# Patient Record
Sex: Female | Born: 1935 | Race: White | Hispanic: No | Smoking: Never smoker
Health system: Southern US, Community
[De-identification: ages and names within clinical notes are randomized; demographics above are authoritative.]

## PROBLEM LIST (undated history)

## (undated) DIAGNOSIS — I10 Essential (primary) hypertension: Secondary | ICD-10-CM

## (undated) DIAGNOSIS — K859 Acute pancreatitis without necrosis or infection, unspecified: Secondary | ICD-10-CM

## (undated) HISTORY — PX: ABDOMINAL HYSTERECTOMY: SHX81

---

## 2002-01-02 ENCOUNTER — Encounter: Payer: Self-pay | Admitting: Emergency Medicine

## 2002-01-02 ENCOUNTER — Emergency Department (HOSPITAL_COMMUNITY): Admission: EM | Admit: 2002-01-02 | Discharge: 2002-01-02 | Payer: Self-pay | Admitting: Emergency Medicine

## 2006-10-07 ENCOUNTER — Emergency Department (HOSPITAL_COMMUNITY): Admission: EM | Admit: 2006-10-07 | Discharge: 2006-10-07 | Payer: Self-pay | Admitting: Emergency Medicine

## 2009-04-02 ENCOUNTER — Emergency Department (HOSPITAL_COMMUNITY): Admission: EM | Admit: 2009-04-02 | Discharge: 2009-04-02 | Payer: Self-pay | Admitting: Emergency Medicine

## 2009-12-03 ENCOUNTER — Observation Stay (HOSPITAL_COMMUNITY): Admission: EM | Admit: 2009-12-03 | Discharge: 2009-12-04 | Payer: Self-pay | Admitting: Emergency Medicine

## 2009-12-04 ENCOUNTER — Encounter (INDEPENDENT_AMBULATORY_CARE_PROVIDER_SITE_OTHER): Payer: Self-pay | Admitting: Internal Medicine

## 2010-01-15 ENCOUNTER — Emergency Department (HOSPITAL_COMMUNITY): Admission: EM | Admit: 2010-01-15 | Discharge: 2010-01-15 | Payer: Self-pay | Admitting: Emergency Medicine

## 2010-06-16 ENCOUNTER — Inpatient Hospital Stay (HOSPITAL_COMMUNITY): Admission: EM | Admit: 2010-06-16 | Discharge: 2010-06-22 | Payer: Self-pay | Admitting: Emergency Medicine

## 2010-06-18 ENCOUNTER — Encounter (INDEPENDENT_AMBULATORY_CARE_PROVIDER_SITE_OTHER): Payer: Self-pay | Admitting: Gastroenterology

## 2010-06-30 ENCOUNTER — Inpatient Hospital Stay (HOSPITAL_COMMUNITY): Admission: EM | Admit: 2010-06-30 | Discharge: 2010-07-05 | Payer: Self-pay | Admitting: Emergency Medicine

## 2010-07-30 ENCOUNTER — Encounter: Admission: RE | Admit: 2010-07-30 | Discharge: 2010-07-30 | Payer: Self-pay | Admitting: Gastroenterology

## 2010-08-15 ENCOUNTER — Ambulatory Visit (HOSPITAL_COMMUNITY): Admission: RE | Admit: 2010-08-15 | Discharge: 2010-08-15 | Payer: Self-pay | Admitting: Gastroenterology

## 2010-09-05 ENCOUNTER — Encounter
Admission: RE | Admit: 2010-09-05 | Discharge: 2010-09-05 | Payer: Self-pay | Source: Home / Self Care | Attending: Gastroenterology | Admitting: Gastroenterology

## 2010-10-30 ENCOUNTER — Encounter (HOSPITAL_COMMUNITY): Payer: MEDICARE | Attending: Surgery

## 2010-10-30 LAB — CBC
Hemoglobin: 13.8 g/dL (ref 12.0–15.0)
MCV: 86.7 fL (ref 78.0–100.0)
Platelets: 203 10*3/uL (ref 150–400)
RBC: 4.89 MIL/uL (ref 3.87–5.11)
WBC: 8.7 10*3/uL (ref 4.0–10.5)

## 2010-10-30 LAB — SURGICAL PCR SCREEN
MRSA, PCR: NEGATIVE
Staphylococcus aureus: NEGATIVE

## 2010-10-30 LAB — BASIC METABOLIC PANEL
BUN: 12 mg/dL (ref 6–23)
Chloride: 105 mEq/L (ref 96–112)
GFR calc Af Amer: >60 mL/min (ref >60)
Potassium: 4.4 mEq/L (ref 3.5–5.1)

## 2010-11-02 ENCOUNTER — Ambulatory Visit (HOSPITAL_COMMUNITY): Payer: MEDICARE

## 2010-11-02 ENCOUNTER — Other Ambulatory Visit: Payer: Self-pay | Admitting: Surgery

## 2010-11-02 ENCOUNTER — Observation Stay (HOSPITAL_COMMUNITY)
Admission: RE | Admit: 2010-11-02 | Discharge: 2010-11-03 | Disposition: A | Discharge status: Home / Self Care | Payer: MEDICARE | Attending: Surgery | Admitting: Surgery

## 2010-11-02 DIAGNOSIS — K8020 Calculus of gallbladder without cholecystitis without obstruction: Principal | ICD-10-CM | POA: Insufficient documentation

## 2010-11-02 DIAGNOSIS — K219 Gastro-esophageal reflux disease without esophagitis: Secondary | ICD-10-CM | POA: Insufficient documentation

## 2010-11-02 DIAGNOSIS — Z01812 Encounter for preprocedural laboratory examination: Secondary | ICD-10-CM | POA: Insufficient documentation

## 2010-11-02 DIAGNOSIS — R1011 Right upper quadrant pain: Secondary | ICD-10-CM

## 2010-11-02 DIAGNOSIS — K8010 Calculus of gallbladder with chronic cholecystitis without obstruction: Secondary | ICD-10-CM | POA: Insufficient documentation

## 2010-11-02 DIAGNOSIS — I10 Essential (primary) hypertension: Secondary | ICD-10-CM | POA: Insufficient documentation

## 2010-11-03 NOTE — Op Note (Signed)
  [name], Latasha Maxwell               ACCOUNT NO.:  [id number]  MEDICAL RECORD NO.:  [id number]           PATIENT TYPE:  O  LOCATION:  DAYL                         FACILITY:  [facility]  PHYSICIAN:  Thornton Park. Daphine Deutscher, MD  DATE OF BIRTH:  July 26, 1931  DATE OF PROCEDURE: DATE OF DISCHARGE:                              OPERATIVE REPORT   PREOPERATIVE DIAGNOSIS:  A 77 year old white female with history of pancreatitis and pseudocyst related to gallstones.  PROCEDURE:  Laparoscopic cholecystectomy with intraoperative cholangiogram.  POSTOPERATIVE DIAGNOSIS:  Chronic cholecystitis, cholelithiasis.  SURGEON:  Thornton Park. Daphine Deutscher, MD  ASSISTANT:  P.J. Carolynne Edouard.  DESCRIPTION OF PROCEDURE:  This 77 year old white female was brought to OR-1 at [facility] on November 02, 2010 and given general anesthesia. The abdomen was prepped with PCMX and draped sterilely.  I entered the abdomen using Hassan technique through the umbilicus and insufflated. Standard trocar placements confirmed, placed the 10 in the upper midline.  The gallbladder had a lot of adhesions to it which I took down bluntly.  Dissection of Calot's triangle was performed and a critical view was achieved.  A clip was placed on the gallbladder and I incised the cystic duct to get dynamic cholangiogram with C-arm which revealed long cystic duct, intrahepatic filling, free flow in the duodenum, no stones.  The cystic duct was triple clipped and divided.  The gallbladder was removed from the gallbladder bed and then placed in a bag and brought out through the umbilicus.  Gallbladder bed was coagulated near the top.  There was a little bleeder.  Hemostasis was achieved.  There was a little bit of bile spillage at the top as well and this was evacuated but made the wound class 3.  The umbilical defect was repaired under laparoscopic vision with figure- of-eight suture of 0 Vicryl.  Port sites were all injected and were then closed with 4-0  Vicryl with a __________ adhesive to the skin.  The patient was taken to recovery room in satisfactory condition.     Thornton Park Daphine Deutscher, MD     MBM/MEDQ  D:  11/02/2010  T:  11/02/2010  Job:  [id number]  cc:   Willis Modena, MD Fax: [phone]  Dr. Knox Royalty  Electronically Signed by Luretha Murphy MD on 11/03/2010 07:48:26 AM

## 2010-12-04 LAB — AMYLASE, BODY FLUID

## 2010-12-04 LAB — CEA (CARCINOEMBRYONIC ANTIGEN), FLUID: CEA Fluid: <0.5 ng/mL (ref <10.0)

## 2010-12-05 LAB — COMPREHENSIVE METABOLIC PANEL
ALT: 11 U/L (ref 0–35)
ALT: 13 U/L (ref 0–35)
AST: 18 U/L (ref 0–37)
Alkaline Phosphatase: 94 U/L (ref 39–117)
BUN: 3 mg/dL — ABNORMAL LOW (ref 6–23)
CO2: 22 mEq/L (ref 19–32)
Calcium: 8.6 mg/dL (ref 8.4–10.5)
Calcium: 8.7 mg/dL (ref 8.4–10.5)
Calcium: 9.5 mg/dL (ref 8.4–10.5)
Chloride: 105 mEq/L (ref 96–112)
Creatinine, Ser: 0.61 mg/dL (ref 0.4–1.2)
Creatinine, Ser: 0.73 mg/dL (ref 0.4–1.2)
GFR calc Af Amer: >60 mL/min (ref >60)
GFR calc non Af Amer: >60 mL/min (ref >60)
Glucose, Bld: 102 mg/dL — ABNORMAL HIGH (ref 70–99)
Glucose, Bld: 149 mg/dL — ABNORMAL HIGH (ref 70–99)
Glucose, Bld: 89 mg/dL (ref 70–99)
Potassium: 3.9 mEq/L (ref 3.5–5.1)
Sodium: 136 mEq/L (ref 135–145)
Sodium: 137 mEq/L (ref 135–145)
Sodium: 137 mEq/L (ref 135–145)
Total Bilirubin: 1.5 mg/dL — ABNORMAL HIGH (ref 0.3–1.2)
Total Protein: 5.7 g/dL — ABNORMAL LOW (ref 6.0–8.3)
Total Protein: 5.8 g/dL — ABNORMAL LOW (ref 6.0–8.3)
Total Protein: 5.9 g/dL — ABNORMAL LOW (ref 6.0–8.3)
Total Protein: 7.2 g/dL (ref 6.0–8.3)

## 2010-12-05 LAB — CBC
HCT: 31.1 % — ABNORMAL LOW (ref 36.0–46.0)
HCT: 31.2 % — ABNORMAL LOW (ref 36.0–46.0)
HCT: 31.6 % — ABNORMAL LOW (ref 36.0–46.0)
HCT: 32.5 % — ABNORMAL LOW (ref 36.0–46.0)
Hemoglobin: 10.3 g/dL — ABNORMAL LOW (ref 12.0–15.0)
Hemoglobin: 10.5 g/dL — ABNORMAL LOW (ref 12.0–15.0)
MCH: 29.2 pg (ref 26.0–34.0)
MCHC: 33.5 g/dL (ref 30.0–36.0)
MCHC: 33.6 g/dL (ref 30.0–36.0)
MCHC: 33.7 g/dL (ref 30.0–36.0)
MCHC: 33.8 g/dL (ref 30.0–36.0)
MCV: 86.3 fL (ref 78.0–100.0)
MCV: 87.1 fL (ref 78.0–100.0)
MCV: 87.7 fL (ref 78.0–100.0)
Platelets: 452 10*3/uL — ABNORMAL HIGH (ref 150–400)
RBC: 3.55 MIL/uL — ABNORMAL LOW (ref 3.87–5.11)
RDW: 14 % (ref 11.5–15.5)
RDW: 14 % (ref 11.5–15.5)
RDW: 14.1 % (ref 11.5–15.5)
RDW: 14.1 % (ref 11.5–15.5)
RDW: 14.2 % (ref 11.5–15.5)
WBC: 10.4 10*3/uL (ref 4.0–10.5)

## 2010-12-05 LAB — DIFFERENTIAL
Eosinophils Absolute: 0.1 10*3/uL (ref 0.0–0.7)
Eosinophils Relative: 1 % (ref 0–5)
Lymphocytes Relative: 13 % (ref 12–46)
Lymphs Abs: 1.5 10*3/uL (ref 0.7–4.0)
Lymphs Abs: 1.7 10*3/uL (ref 0.7–4.0)
Monocytes Relative: 6 % (ref 3–12)
Monocytes Relative: 8 % (ref 3–12)
Neutro Abs: 8.7 10*3/uL — ABNORMAL HIGH (ref 1.7–7.7)
Neutrophils Relative %: 77 % (ref 43–77)
Neutrophils Relative %: 79 % — ABNORMAL HIGH (ref 43–77)

## 2010-12-05 LAB — GLUCOSE, CAPILLARY
Glucose-Capillary: 123 mg/dL — ABNORMAL HIGH (ref 70–99)
Glucose-Capillary: 126 mg/dL — ABNORMAL HIGH (ref 70–99)
Glucose-Capillary: 155 mg/dL — ABNORMAL HIGH (ref 70–99)
Glucose-Capillary: 162 mg/dL — ABNORMAL HIGH (ref 70–99)
Glucose-Capillary: 175 mg/dL — ABNORMAL HIGH (ref 70–99)
Glucose-Capillary: 189 mg/dL — ABNORMAL HIGH (ref 70–99)

## 2010-12-05 LAB — LIPID PANEL
Cholesterol: 148 mg/dL (ref 0–200)
LDL Cholesterol: 106 mg/dL — ABNORMAL HIGH (ref 0–99)
Total CHOL/HDL Ratio: 7 RATIO
Triglycerides: 106 mg/dL (ref <150)
VLDL: 21 mg/dL (ref 0–40)

## 2010-12-05 LAB — URINE MICROSCOPIC-ADD ON

## 2010-12-05 LAB — PROTIME-INR: INR: 1.13 (ref 0.00–1.49)

## 2010-12-05 LAB — URINE CULTURE: Colony Count: 35000

## 2010-12-05 LAB — BASIC METABOLIC PANEL
BUN: 3 mg/dL — ABNORMAL LOW (ref 6–23)
Chloride: 103 mEq/L (ref 96–112)
Creatinine, Ser: 0.71 mg/dL (ref 0.4–1.2)
GFR calc non Af Amer: >60 mL/min (ref >60)
Glucose, Bld: 139 mg/dL — ABNORMAL HIGH (ref 70–99)
Potassium: 3.8 mEq/L (ref 3.5–5.1)

## 2010-12-05 LAB — URINALYSIS, ROUTINE W REFLEX MICROSCOPIC
Glucose, UA: NEGATIVE mg/dL
Ketones, ur: >80 mg/dL — AB
pH: 5.5 (ref 5.0–8.0)

## 2010-12-05 LAB — LIPASE, BLOOD: Lipase: 134 U/L — ABNORMAL HIGH (ref 11–59)

## 2010-12-05 LAB — APTT: aPTT: 35 seconds (ref 24–37)

## 2010-12-05 LAB — CULTURE, BLOOD (ROUTINE X 2)
Culture  Setup Time: 201110091203
Culture  Setup Time: 201110091208

## 2010-12-05 LAB — GLYCOHEMOGLOBIN, TOTAL: Hemoglobin-A1c: 7.6 % — ABNORMAL HIGH (ref 5.4–7.4)

## 2010-12-06 LAB — BASIC METABOLIC PANEL
BUN: 3 mg/dL — ABNORMAL LOW (ref 6–23)
CO2: 25 mEq/L (ref 19–32)
Chloride: 107 mEq/L (ref 96–112)
Chloride: 107 mEq/L (ref 96–112)
Creatinine, Ser: 0.61 mg/dL (ref 0.4–1.2)
GFR calc Af Amer: >60 mL/min (ref >60)
Glucose, Bld: 143 mg/dL — ABNORMAL HIGH (ref 70–99)
Potassium: 2.8 mEq/L — ABNORMAL LOW (ref 3.5–5.1)
Potassium: 3.7 mEq/L (ref 3.5–5.1)

## 2010-12-06 LAB — COMPREHENSIVE METABOLIC PANEL
ALT: 26 U/L (ref 0–35)
ALT: 48 U/L — ABNORMAL HIGH (ref 0–35)
ALT: 73 U/L — ABNORMAL HIGH (ref 0–35)
AST: 20 U/L (ref 0–37)
AST: 21 U/L (ref 0–37)
AST: 23 U/L (ref 0–37)
AST: 37 U/L (ref 0–37)
Albumin: 2.2 g/dL — ABNORMAL LOW (ref 3.5–5.2)
Albumin: 2.4 g/dL — ABNORMAL LOW (ref 3.5–5.2)
Albumin: 2.4 g/dL — ABNORMAL LOW (ref 3.5–5.2)
Albumin: 3 g/dL — ABNORMAL LOW (ref 3.5–5.2)
Alkaline Phosphatase: 110 U/L (ref 39–117)
Alkaline Phosphatase: 81 U/L (ref 39–117)
Alkaline Phosphatase: 99 U/L (ref 39–117)
BUN: 10 mg/dL (ref 6–23)
BUN: 11 mg/dL (ref 6–23)
CO2: 21 mEq/L (ref 19–32)
Calcium: 7.5 mg/dL — ABNORMAL LOW (ref 8.4–10.5)
Chloride: 106 mEq/L (ref 96–112)
Chloride: 110 mEq/L (ref 96–112)
Creatinine, Ser: 0.76 mg/dL (ref 0.4–1.2)
Creatinine, Ser: 0.76 mg/dL (ref 0.4–1.2)
GFR calc Af Amer: 54 mL/min — ABNORMAL LOW (ref >60)
GFR calc Af Amer: >60 mL/min (ref >60)
GFR calc Af Amer: >60 mL/min (ref >60)
GFR calc Af Amer: >60 mL/min (ref >60)
GFR calc Af Amer: >60 mL/min (ref >60)
GFR calc non Af Amer: >60 mL/min (ref >60)
Glucose, Bld: 121 mg/dL — ABNORMAL HIGH (ref 70–99)
Glucose, Bld: 79 mg/dL (ref 70–99)
Potassium: 2.8 mEq/L — ABNORMAL LOW (ref 3.5–5.1)
Potassium: 3 mEq/L — ABNORMAL LOW (ref 3.5–5.1)
Potassium: 3.7 mEq/L (ref 3.5–5.1)
Potassium: 4.1 mEq/L (ref 3.5–5.1)
Sodium: 133 mEq/L — ABNORMAL LOW (ref 135–145)
Sodium: 137 mEq/L (ref 135–145)
Sodium: 139 mEq/L (ref 135–145)
Total Bilirubin: 1.3 mg/dL — ABNORMAL HIGH (ref 0.3–1.2)
Total Bilirubin: 1.7 mg/dL — ABNORMAL HIGH (ref 0.3–1.2)
Total Protein: 5 g/dL — ABNORMAL LOW (ref 6.0–8.3)
Total Protein: 5.3 g/dL — ABNORMAL LOW (ref 6.0–8.3)
Total Protein: 5.4 g/dL — ABNORMAL LOW (ref 6.0–8.3)
Total Protein: 6.5 g/dL (ref 6.0–8.3)

## 2010-12-06 LAB — CULTURE, BLOOD (ROUTINE X 2)
Culture  Setup Time: 201109251437
Culture  Setup Time: 201109251437
Culture: NO GROWTH

## 2010-12-06 LAB — CBC
HCT: 31.7 % — ABNORMAL LOW (ref 36.0–46.0)
HCT: 32.2 % — ABNORMAL LOW (ref 36.0–46.0)
HCT: 43.8 % (ref 36.0–46.0)
Hemoglobin: 10.8 g/dL — ABNORMAL LOW (ref 12.0–15.0)
MCH: 29.9 pg (ref 26.0–34.0)
MCH: 29.9 pg (ref 26.0–34.0)
MCH: 30 pg (ref 26.0–34.0)
MCHC: 33.5 g/dL (ref 30.0–36.0)
MCV: 88.3 fL (ref 78.0–100.0)
MCV: 88.4 fL (ref 78.0–100.0)
MCV: 88.6 fL (ref 78.0–100.0)
MCV: 89.4 fL (ref 78.0–100.0)
Platelets: 126 10*3/uL — ABNORMAL LOW (ref 150–400)
Platelets: 156 10*3/uL (ref 150–400)
Platelets: 168 10*3/uL (ref 150–400)
Platelets: 249 10*3/uL (ref 150–400)
Platelets: 282 10*3/uL (ref 150–400)
RBC: 3.51 MIL/uL — ABNORMAL LOW (ref 3.87–5.11)
RBC: 3.59 MIL/uL — ABNORMAL LOW (ref 3.87–5.11)
RBC: 3.61 MIL/uL — ABNORMAL LOW (ref 3.87–5.11)
RBC: 4.08 MIL/uL (ref 3.87–5.11)
RDW: 14.1 % (ref 11.5–15.5)
RDW: 14.3 % (ref 11.5–15.5)
RDW: 14.3 % (ref 11.5–15.5)
RDW: 14.7 % (ref 11.5–15.5)
WBC: 15.5 10*3/uL — ABNORMAL HIGH (ref 4.0–10.5)
WBC: 17 10*3/uL — ABNORMAL HIGH (ref 4.0–10.5)
WBC: 17.4 10*3/uL — ABNORMAL HIGH (ref 4.0–10.5)
WBC: 22.9 10*3/uL — ABNORMAL HIGH (ref 4.0–10.5)

## 2010-12-06 LAB — LIPASE, BLOOD: Lipase: 35 U/L (ref 11–59)

## 2010-12-06 LAB — URINE MICROSCOPIC-ADD ON

## 2010-12-06 LAB — DIFFERENTIAL
Basophils Relative: 0 % (ref 0–1)
Eosinophils Absolute: 0 10*3/uL (ref 0.0–0.7)
Eosinophils Absolute: 0.2 10*3/uL (ref 0.0–0.7)
Eosinophils Relative: 1 % (ref 0–5)
Lymphs Abs: 0.9 10*3/uL (ref 0.7–4.0)
Lymphs Abs: 1.3 10*3/uL (ref 0.7–4.0)
Monocytes Absolute: 0.7 10*3/uL (ref 0.1–1.0)
Monocytes Absolute: 0.8 10*3/uL (ref 0.1–1.0)
Monocytes Relative: 5 % (ref 3–12)
Neutro Abs: 21.3 10*3/uL — ABNORMAL HIGH (ref 1.7–7.7)

## 2010-12-06 LAB — URINE CULTURE

## 2010-12-06 LAB — CLOSTRIDIUM DIFFICILE EIA

## 2010-12-06 LAB — URINALYSIS, ROUTINE W REFLEX MICROSCOPIC
Bilirubin Urine: NEGATIVE
Glucose, UA: NEGATIVE mg/dL
Specific Gravity, Urine: 1.022 (ref 1.005–1.030)
pH: 5.5 (ref 5.0–8.0)

## 2010-12-06 LAB — CLOSTRIDIUM DIFFICILE BY PCR: Toxigenic C. Difficile by PCR: NOT DETECTED

## 2010-12-06 LAB — MRSA PCR SCREENING: MRSA by PCR: NEGATIVE

## 2010-12-11 LAB — POCT I-STAT, CHEM 8
Chloride: 103 mEq/L (ref 96–112)
Creatinine, Ser: 0.9 mg/dL (ref 0.4–1.2)
Glucose, Bld: 123 mg/dL — ABNORMAL HIGH (ref 70–99)
HCT: 45 % (ref 36.0–46.0)
Hemoglobin: 15.3 g/dL — ABNORMAL HIGH (ref 12.0–15.0)
Potassium: 3.8 mEq/L (ref 3.5–5.1)
Sodium: 139 mEq/L (ref 135–145)

## 2010-12-11 LAB — COMPREHENSIVE METABOLIC PANEL
AST: 1063 U/L — ABNORMAL HIGH (ref 0–37)
Albumin: 3.9 g/dL (ref 3.5–5.2)
BUN: 10 mg/dL (ref 6–23)
Calcium: 9.5 mg/dL (ref 8.4–10.5)
Creatinine, Ser: 0.94 mg/dL (ref 0.4–1.2)
GFR calc Af Amer: >60 mL/min (ref >60)
Total Protein: 6.7 g/dL (ref 6.0–8.3)

## 2010-12-11 LAB — CBC
MCHC: 34.5 g/dL (ref 30.0–36.0)
MCV: 88.7 fL (ref 78.0–100.0)
Platelets: 180 10*3/uL (ref 150–400)
RDW: 13.8 % (ref 11.5–15.5)
WBC: 3.9 10*3/uL — ABNORMAL LOW (ref 4.0–10.5)

## 2010-12-11 LAB — DIFFERENTIAL
Eosinophils Relative: 1 % (ref 0–5)
Lymphocytes Relative: 28 % (ref 12–46)
Lymphs Abs: 1.1 10*3/uL (ref 0.7–4.0)
Monocytes Absolute: 0.3 10*3/uL (ref 0.1–1.0)
Neutro Abs: 2.4 10*3/uL (ref 1.7–7.7)

## 2010-12-11 LAB — URINALYSIS, ROUTINE W REFLEX MICROSCOPIC
Bilirubin Urine: NEGATIVE
Hgb urine dipstick: NEGATIVE
Nitrite: NEGATIVE
Protein, ur: NEGATIVE mg/dL
Specific Gravity, Urine: 1.011 (ref 1.005–1.030)
Urobilinogen, UA: 0.2 mg/dL (ref 0.0–1.0)

## 2010-12-11 LAB — POCT CARDIAC MARKERS
CKMB, poc: <1 ng/mL — ABNORMAL LOW (ref 1.0–8.0)
Myoglobin, poc: 51.6 ng/mL (ref 12–200)
Troponin i, poc: <0.05 ng/mL (ref 0.00–0.09)

## 2010-12-17 LAB — CBC
Hemoglobin: 12.5 g/dL (ref 12.0–15.0)
MCHC: 33.9 g/dL (ref 30.0–36.0)
MCHC: 34.4 g/dL (ref 30.0–36.0)
Platelets: 213 10*3/uL (ref 150–400)
RBC: 4.06 MIL/uL (ref 3.87–5.11)
RDW: 13.4 % (ref 11.5–15.5)

## 2010-12-17 LAB — DIFFERENTIAL
Basophils Absolute: 0.1 10*3/uL (ref 0.0–0.1)
Basophils Relative: 0 % (ref 0–1)
Eosinophils Absolute: 0.1 10*3/uL (ref 0.0–0.7)
Eosinophils Relative: 1 % (ref 0–5)
Lymphocytes Relative: 15 % (ref 12–46)
Monocytes Absolute: 0.7 10*3/uL (ref 0.1–1.0)

## 2010-12-17 LAB — HEPATIC FUNCTION PANEL
AST: 21 U/L (ref 0–37)
Bilirubin, Direct: <0.1 mg/dL (ref 0.0–0.3)
Total Bilirubin: 0.7 mg/dL (ref 0.3–1.2)

## 2010-12-17 LAB — CULTURE, BLOOD (ROUTINE X 2): Culture: NO GROWTH

## 2010-12-17 LAB — LIPID PANEL
Cholesterol: 258 mg/dL — ABNORMAL HIGH (ref 0–200)
HDL: 44 mg/dL (ref >39)
LDL Cholesterol: 132 mg/dL — ABNORMAL HIGH (ref 0–99)
Total CHOL/HDL Ratio: 4.6 RATIO
Total CHOL/HDL Ratio: 5.9 RATIO
Triglycerides: 136 mg/dL (ref <150)
VLDL: 25 mg/dL (ref 0–40)
VLDL: 27 mg/dL (ref 0–40)

## 2010-12-17 LAB — BASIC METABOLIC PANEL
BUN: 13 mg/dL (ref 6–23)
CO2: 24 mEq/L (ref 19–32)
CO2: 25 mEq/L (ref 19–32)
Calcium: 9 mg/dL (ref 8.4–10.5)
Calcium: 9.2 mg/dL (ref 8.4–10.5)
Creatinine, Ser: 1.1 mg/dL (ref 0.4–1.2)
GFR calc Af Amer: >60 mL/min (ref >60)
GFR calc non Af Amer: 48 mL/min — ABNORMAL LOW (ref >60)
Glucose, Bld: 136 mg/dL — ABNORMAL HIGH (ref 70–99)
Potassium: 3.7 mEq/L (ref 3.5–5.1)
Sodium: 134 mEq/L — ABNORMAL LOW (ref 135–145)
Sodium: 139 mEq/L (ref 135–145)

## 2010-12-17 LAB — D-DIMER, QUANTITATIVE: D-Dimer, Quant: 0.76 ug/mL-FEU — ABNORMAL HIGH (ref 0.00–0.48)

## 2010-12-17 LAB — CARDIAC PANEL(CRET KIN+CKTOT+MB+TROPI)
Relative Index: INVALID (ref 0.0–2.5)
Relative Index: INVALID (ref 0.0–2.5)
Troponin I: 0.01 ng/mL (ref 0.00–0.06)

## 2010-12-17 LAB — TROPONIN I: Troponin I: 0.02 ng/mL (ref 0.00–0.06)

## 2010-12-17 LAB — CK TOTAL AND CKMB (NOT AT ARMC)
CK, MB: 1.4 ng/mL (ref 0.3–4.0)
Relative Index: INVALID (ref 0.0–2.5)
Total CK: 46 U/L (ref 7–177)

## 2010-12-17 LAB — TSH: TSH: 3.319 u[IU]/mL (ref 0.350–4.500)

## 2010-12-30 LAB — POCT I-STAT, CHEM 8
Calcium, Ion: 1.18 mmol/L (ref 1.12–1.32)
Chloride: 109 mEq/L (ref 96–112)
HCT: 42 % (ref 36.0–46.0)
TCO2: 24 mmol/L (ref 0–100)

## 2011-02-07 NOTE — Discharge Summary (Signed)
  [name], Latasha Maxwell               ACCOUNT NO.:  [id number]  MEDICAL RECORD NO.:  [id number]           PATIENT TYPE:  I  LOCATION:  1529                         FACILITY:  [facility]  PHYSICIAN:  Thornton Park. Daphine Deutscher, MD  DATE OF BIRTH:  May 07, 1931  DATE OF ADMISSION:  11/02/2010 DATE OF DISCHARGE:  11/03/2010                              DISCHARGE SUMMARY   CHIEF COMPLAINT:  History of pancreatitis, gallstone-related.  PROCEDURE:  Laparoscopic cholecystectomy with intraoperative cholangiogram.  COURSE IN THE HOSPITAL:  The patient really was an overnight stay for a laparoscopic cholecystectomy.  She was admitted on February 10 and had a laparoscopic cholecystectomy without difficulty.  She did well.  She was kept over and ready for discharge on postoperative day #1, which would be November 03, 2010.  CONDITION ON DISCHARGE:  Condition good.  DIET:  She is taking a regular diet.  No pain.  FOLLOWUP:  Follow up in the office in 2 to 3 weeks.  FINAL DIAGNOSIS:  Apparent biliary pancreatitis status post laparoscopic cholecystectomy.     Thornton Park Daphine Deutscher, MD     MBM/MEDQ  D:  02/01/2011  T:  02/01/2011  Job:  [id number]  Electronically Signed by Luretha Murphy MD on 02/07/2011 07:06:06 AM

## 2011-06-26 IMAGING — CR DG CHEST 1V PORT
1 series · 1 of 1 positions shown · non-contrast
Comparison: 06/18/2010

CLINICAL DATA: Abdominal pain

PORTABLE CHEST - 1 VIEW

[series [date]]
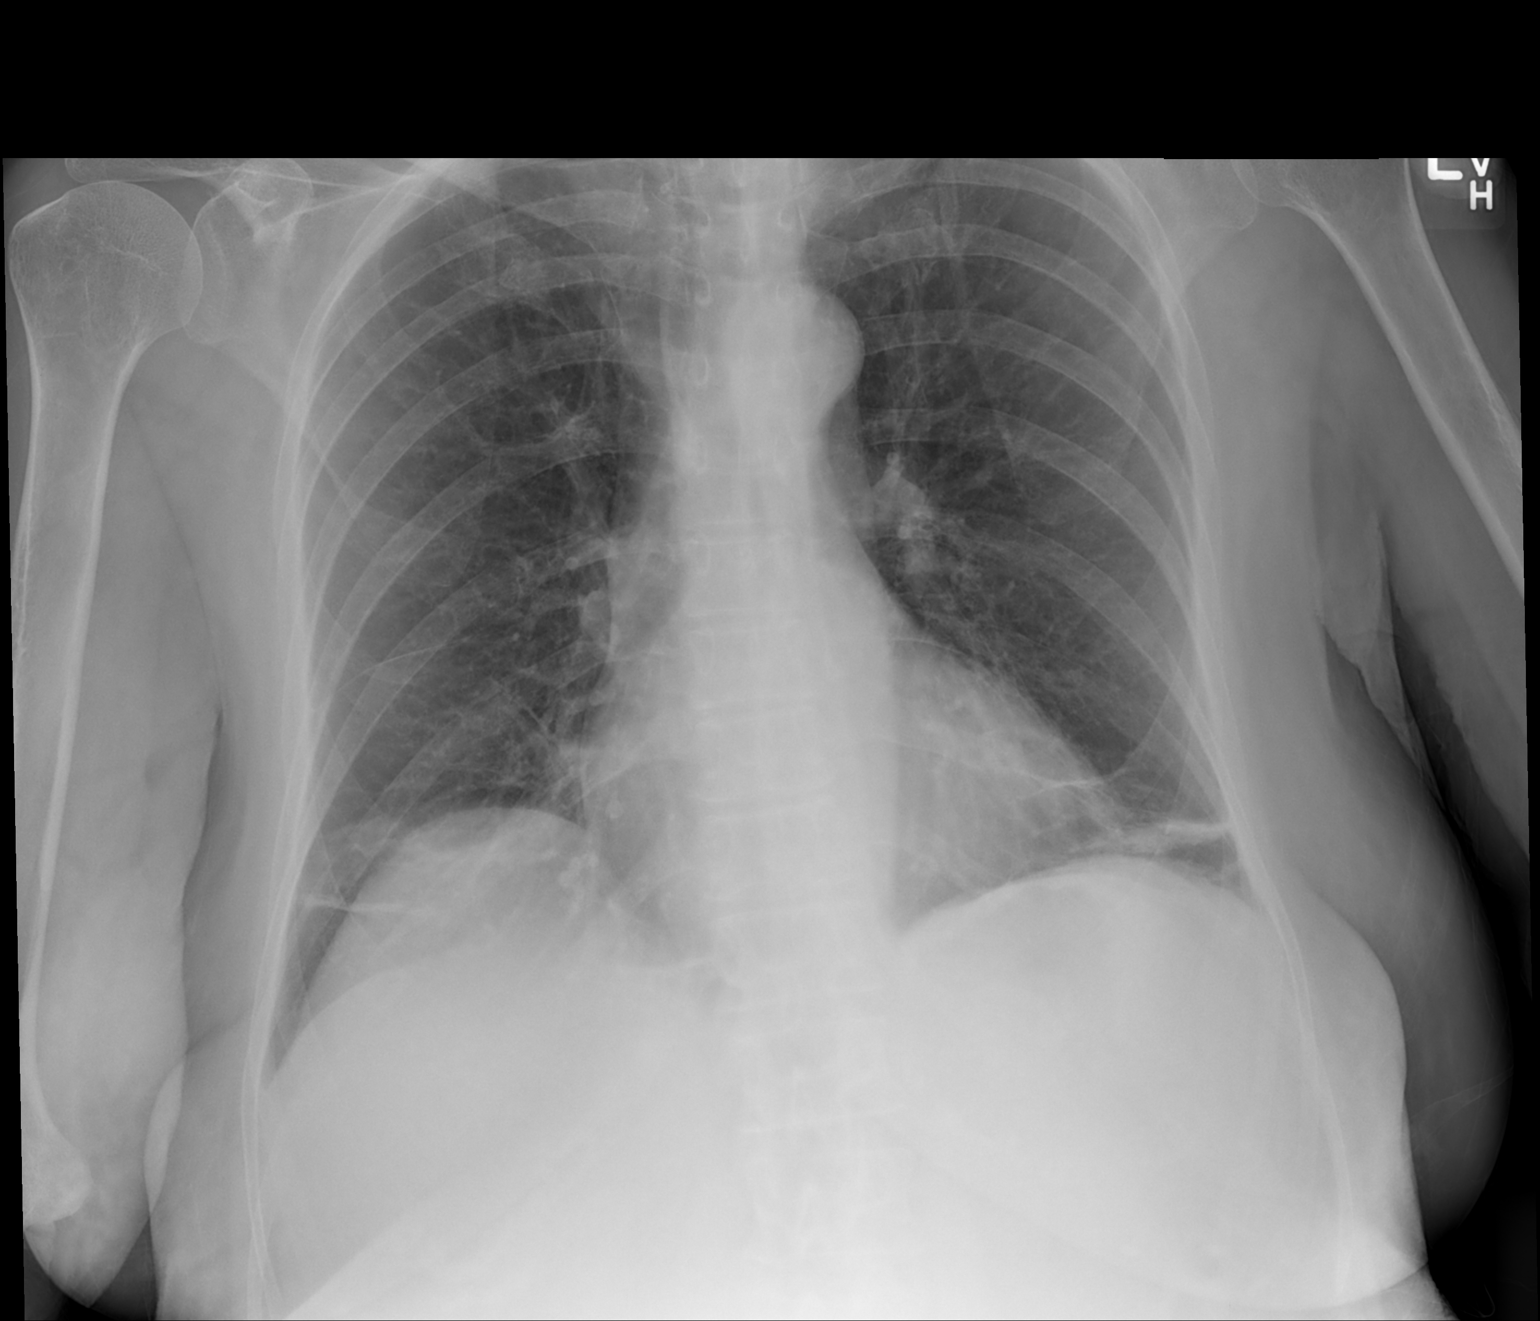

[1 of 1 positions shown; findings below may reference images not displayed]

FINDINGS: Aeration is improved with persistent linear plate-like
bilateral lower lobe atelectasis or scarring noted.  Heart size is
normal.  No new pulmonary opacity.  No pleural effusion.
IMPRESSION: Improved aeration with bibasilar atelectasis or scarring.

## 2011-06-26 IMAGING — CT CT ABD-PELV W/ CM
2 of 5 series · 16 of 46 positions shown, 18 images · IV contrast (APPLIED)
Comparison: 06/16/2010

CLINICAL DATA: Pancreatitis.  Leukocytosis.

CT ABDOMEN AND PELVIS WITH CONTRAST
TECHNIQUE: Multidetector CT imaging of the abdomen and pelvis was
performed following the standard protocol during bolus
administration of intravenous contrast.
Contrast: 125 ml Emnipaque-2QQ

[Series 2: abd_pel 5.0 b40f st · axial · 0.72mm/px · z∈[-432,-26]mm · 13 of 91 slices shown, 15 images]
[im 5/91  soft-tissue]
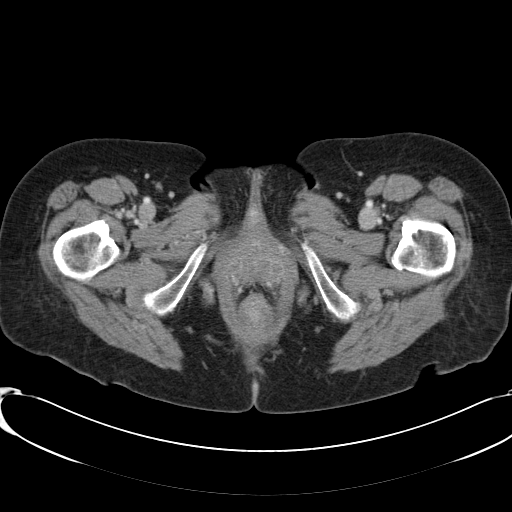
[im 5/91  bone]
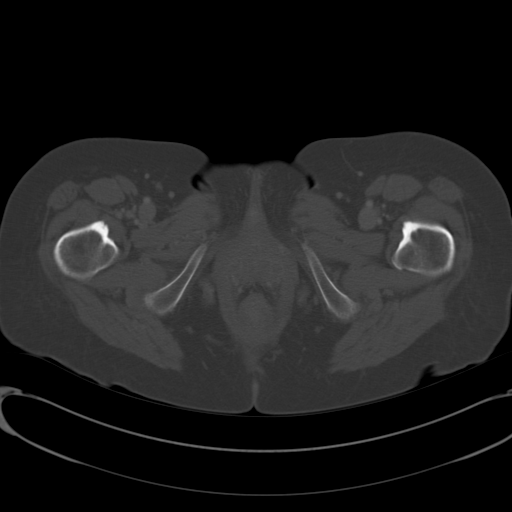
[im 15/91  soft-tissue]
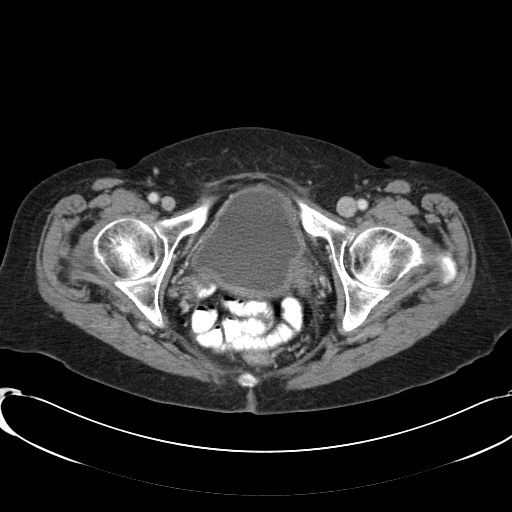
[im 19/91  soft-tissue]
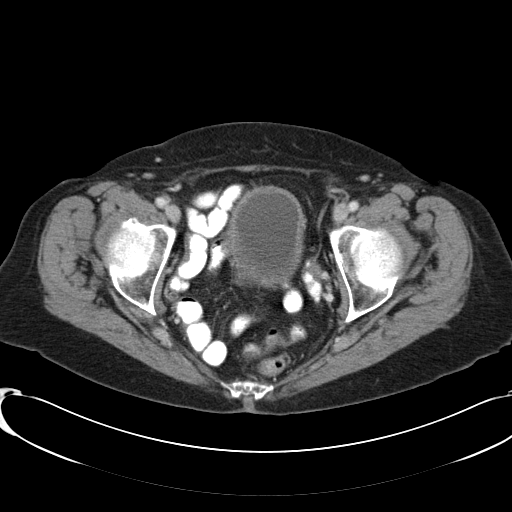
[im 24/91  soft-tissue]
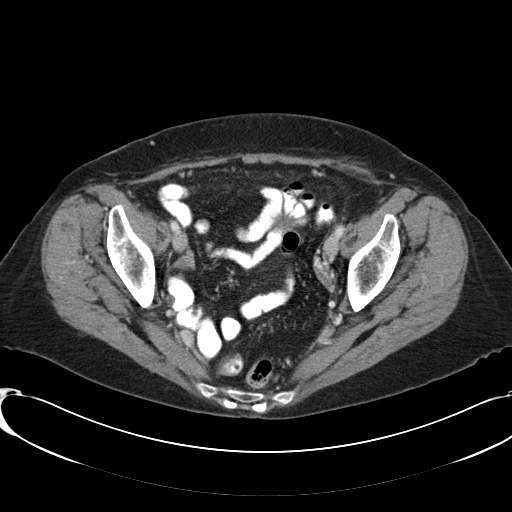
[im 34/91  soft-tissue]
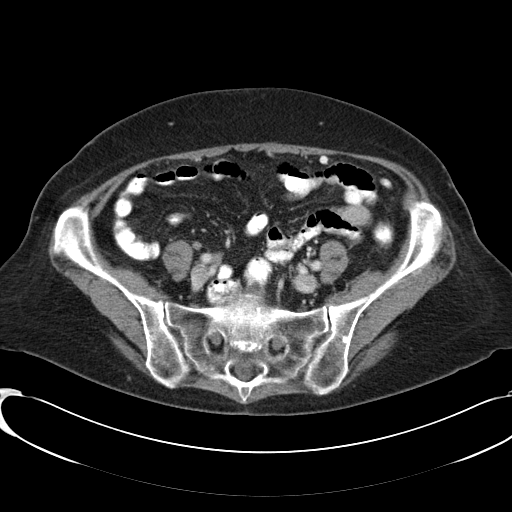
[im 38/91  soft-tissue]
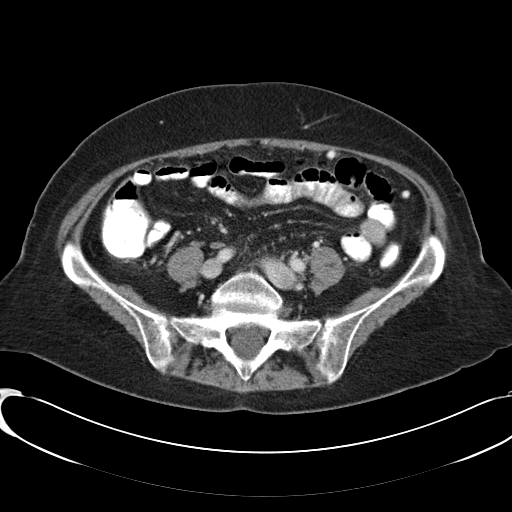
[im 48/91  soft-tissue]
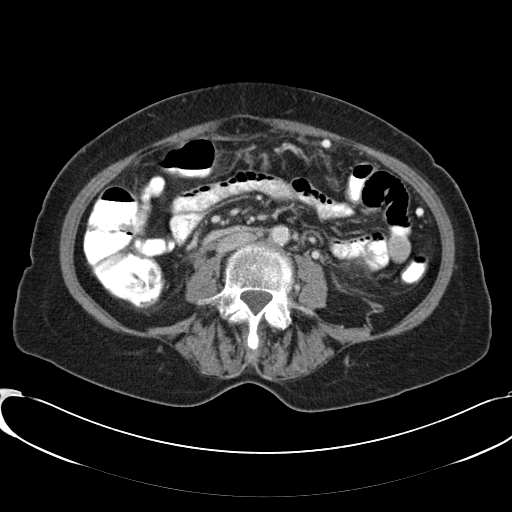
[im 53/91  soft-tissue]
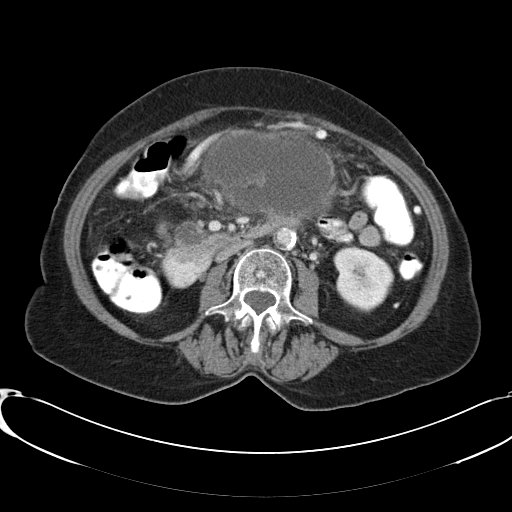
[im 57/91  soft-tissue]
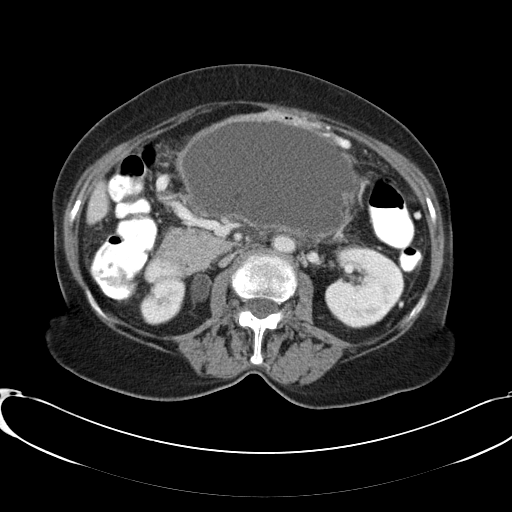
[im 57/91  bone]
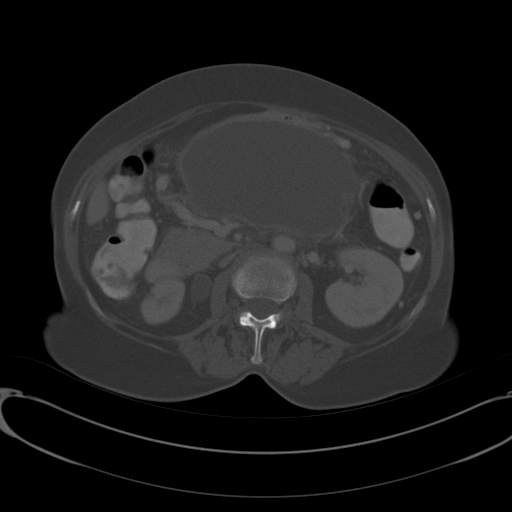
[im 67/91  soft-tissue]
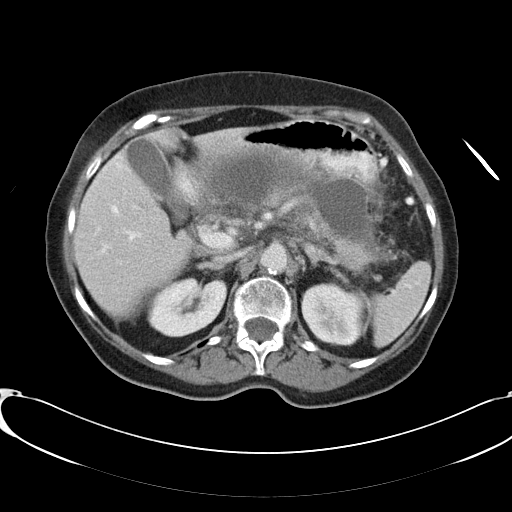
[im 72/91  soft-tissue]
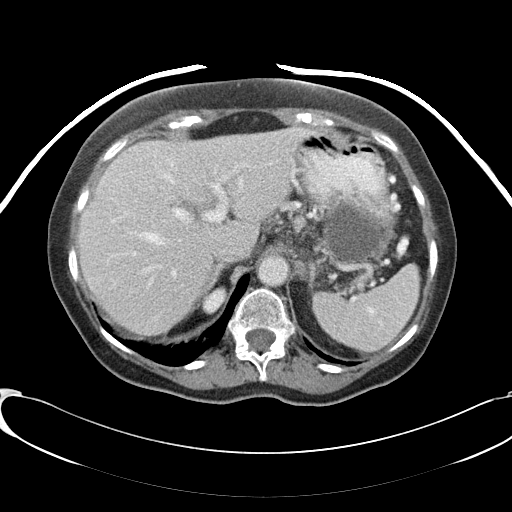
[im 76/91  soft-tissue]
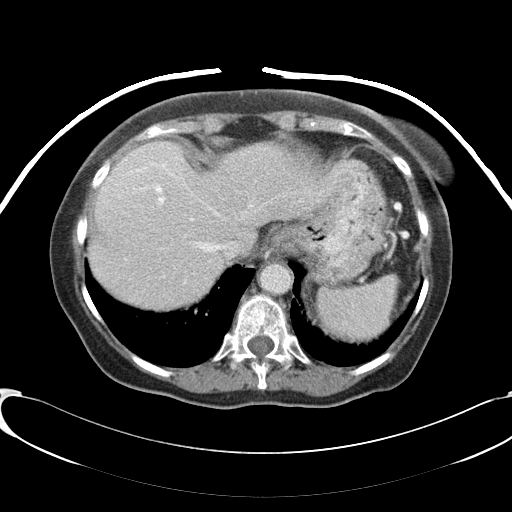
[im 86/91  soft-tissue]
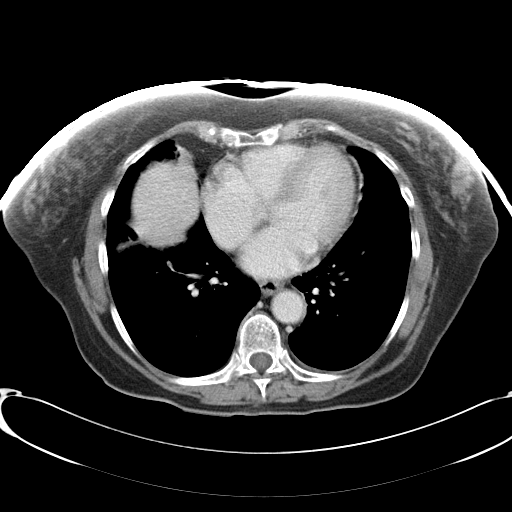

[Series 602: coronal abdomen · coronal · 0.92mm/px · 3 of 126 slices shown]
[im 42/126  soft-tissue]
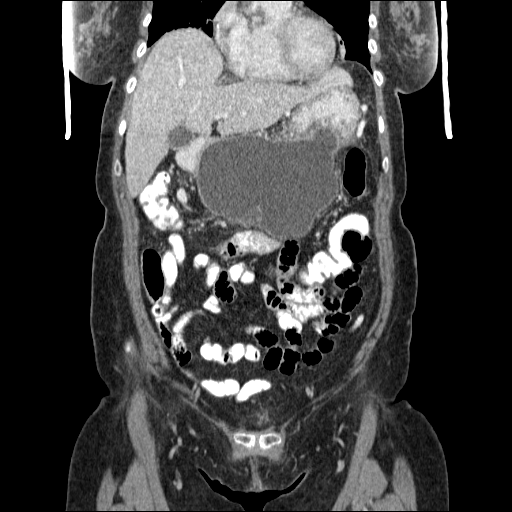
[im 56/126  soft-tissue]
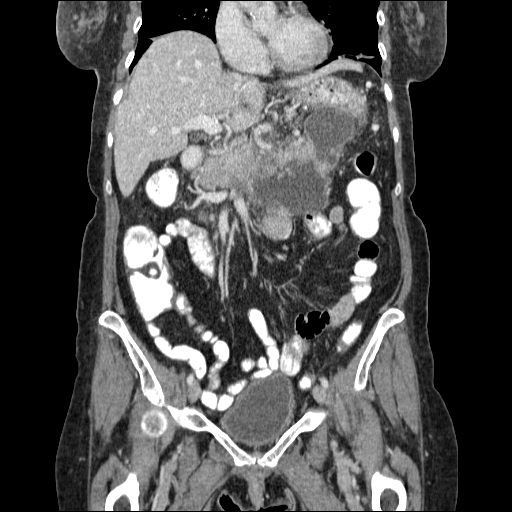
[im 70/126  soft-tissue]
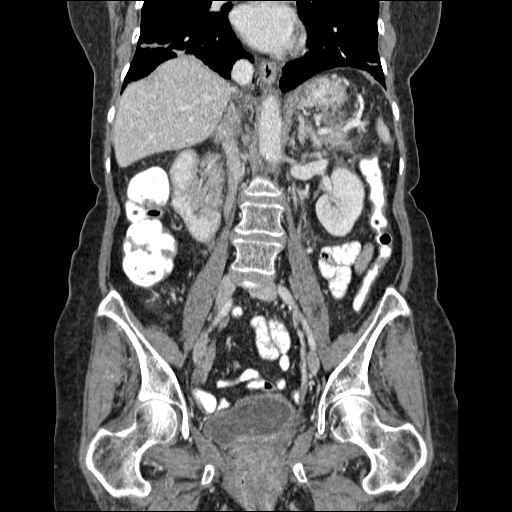

[16 of 46 positions shown; findings below may reference images not displayed]

FINDINGS: Imaging through the lung bases reveals subsegmental
atelectasis.

The liver and spleen are unremarkable. A large fluid collection is
identified between the stomach and a pancreas, measuring 13.2 x
x 9.4 cm.  This has a relatively well organized rim and appears to
be arising along the anterior aspect the pancreas, suggesting that
it is in the anterior pararenal space as opposed to a lesser sac.
This collection tracks up into the splenic hilum.  There is some
heterogeneous enhancement of the pancreatic parenchyma between the
body and the tail regions.  A second area of cystic change is seen
along the anterior aspect of the uncinate process with a tiny
cystic focus in the parenchyma of the uncinate.

There is some edema in the gastrohepatic ligament.

The gallbladder and adrenal glands are unremarkable.  Prominent
right extrarenal pelvis noted although no overt hydronephrosis is
seen in either kidney.  No abdominal aortic aneurysm.

Imaging through the pelvis shows no intraperitoneal free fluid.
Mild bladder wall thickening is evident.  There is evidence of
pelvic floor laxity with a cystocele and probable rectocele.
Diverticulosis is noted in the left colon without diverticulitis.
The terminal ileum is normal. The appendix is not visualized, but
there is no edema or inflammation in the region of the cecum.

Bone windows reveal no worrisome lytic or sclerotic osseous
lesions.
IMPRESSION: Large well-defined fluid collection in the posterior abdomen
appears to be in the anterior pararenal space.  This is just
anterior to the pancreas and is compatible with a large pseudocyst.

New small cystic foci are seen just anterior to the head of the
pancreas and also within the uncinate process, likely related to
the previous pancreatic inflammation.

I personally discussed these results by telephone with Dr. [employer]

## 2017-05-27 ENCOUNTER — Encounter (HOSPITAL_COMMUNITY): Payer: Self-pay | Admitting: *Deleted

## 2017-05-27 ENCOUNTER — Emergency Department (HOSPITAL_COMMUNITY): Payer: Medicare Other

## 2017-05-27 ENCOUNTER — Emergency Department (HOSPITAL_COMMUNITY)
Admission: EM | Admit: 2017-05-27 | Discharge: 2017-05-28 | Disposition: A | Discharge status: Home / Self Care | Payer: Medicare Other | Attending: Emergency Medicine | Admitting: Emergency Medicine

## 2017-05-27 DIAGNOSIS — R0789 Other chest pain: Secondary | ICD-10-CM | POA: Diagnosis not present

## 2017-05-27 DIAGNOSIS — R079 Chest pain, unspecified: Secondary | ICD-10-CM | POA: Diagnosis present

## 2017-05-27 HISTORY — DX: Essential (primary) hypertension: I10

## 2017-05-27 HISTORY — DX: Acute pancreatitis without necrosis or infection, unspecified: K85.90

## 2017-05-27 LAB — CBC
HEMATOCRIT: 41.7 % (ref 36.0–46.0)
HEMOGLOBIN: 13.7 g/dL (ref 12.0–15.0)
MCH: 28.7 pg (ref 26.0–34.0)
MCHC: 32.9 g/dL (ref 30.0–36.0)
MCV: 87.4 fL (ref 78.0–100.0)
Platelets: 169 10*3/uL (ref 150–400)
RBC: 4.77 MIL/uL (ref 3.87–5.11)
RDW: 14 % (ref 11.5–15.5)
WBC: 11.3 10*3/uL — ABNORMAL HIGH (ref 4.0–10.5)

## 2017-05-27 LAB — BASIC METABOLIC PANEL
ANION GAP: 9 (ref 5–15)
BUN: 12 mg/dL (ref 6–20)
CHLORIDE: 104 mmol/L (ref 101–111)
CO2: 23 mmol/L (ref 22–32)
Calcium: 9.5 mg/dL (ref 8.9–10.3)
Creatinine, Ser: 0.86 mg/dL (ref 0.44–1.00)
GFR calc non Af Amer: >60 mL/min (ref >60)
Glucose, Bld: 120 mg/dL — ABNORMAL HIGH (ref 65–99)
Potassium: 3.9 mmol/L (ref 3.5–5.1)
Sodium: 136 mmol/L (ref 135–145)

## 2017-05-27 LAB — I-STAT TROPONIN, ED: Troponin i, poc: 0 ng/mL (ref 0.00–0.08)

## 2017-05-27 NOTE — ED Notes (Signed)
Patient transported to X-ray 

## 2017-05-27 NOTE — ED Triage Notes (Signed)
Pt to ED by EMS c/o sudden substernal CP worse with inspiration. EMS gave 324mg  ASA and nitro x1 which relieved pain. Initial bp 212/170, decreased to 158/88.

## 2017-05-27 NOTE — ED Provider Notes (Signed)
[facility]-EMERGENCY DEPT Provider Note   CSN: [id number] Arrival date & time: 05/27/17  2233     History   Chief Complaint Chief Complaint  Patient presents with  . Chest Pain    HPI Latasha Maxwell is a 83 y.o. female.  Patient was awoken from sleep with substernal chest pain that radiated to her back. No nausea, vomiting, shortness of breath, or diaphoresis. Patient was given one NTG and 324 mg aspirin by EMS with resolution of pain.    Chest Pain   This is a new problem. The current episode started 1 to 2 hours ago. The problem has been resolved. The pain is associated with rest. The pain is present in the substernal region. The pain is moderate. The quality of the pain is described as pressure-like. The pain radiates to the mid back. Pertinent negatives include no abdominal pain, no back pain, no fever, no leg pain, no lower extremity edema, no shortness of breath, no vomiting and no weakness. Risk factors include being elderly.  Her past medical history is significant for hypertension.    No past medical history on file.  There are no active problems to display for this patient.   No past surgical history on file.  OB History    No data available       Home Medications    Prior to Admission medications   Not on File    Family History No family history on file.  Social History Social History  Substance Use Topics  . Smoking status: Not on file  . Smokeless tobacco: Not on file  . Alcohol use Not on file     Allergies   Patient has no allergy information on record.   Review of Systems Review of Systems  Constitutional: Negative for fever.  Respiratory: Negative for shortness of breath.   Cardiovascular: Positive for chest pain.  Gastrointestinal: Negative for abdominal pain and vomiting.  Musculoskeletal: Negative for back pain.  Neurological: Negative for weakness.  All other systems reviewed and are negative.    Physical Exam Updated Vital  Signs BP (!) 158/75 (BP Location: Right Arm)   Pulse 87   Temp 98.7 F (37.1 C) (Oral)   Resp 16   SpO2 96%   Physical Exam  Constitutional: She is oriented to person, place, and time. She appears well-nourished. No distress.  HENT:  Head: Normocephalic.  Eyes: Conjunctivae are normal.  Neck: Neck supple. No JVD present.  Cardiovascular: Normal rate and regular rhythm.   Pulmonary/Chest: Effort normal and breath sounds normal.  Abdominal: Soft. Bowel sounds are normal.  Musculoskeletal: Normal range of motion. She exhibits no edema.  Neurological: She is alert and oriented to person, place, and time.  Skin: Skin is warm and dry.  Psychiatric: She has a normal mood and affect.  Nursing note and vitals reviewed.    ED Treatments / Results  Labs (all labs ordered are listed, but only abnormal results are displayed) Labs Reviewed  BASIC METABOLIC PANEL - Abnormal; Notable for the following:       Result Value   Glucose, Bld 120 (*)    All other components within normal limits  CBC - Abnormal; Notable for the following:    WBC 11.3 (*)    All other components within normal limits  I-STAT TROPONIN, ED  CBG MONITORING, ED  I-STAT TROPONIN, ED  I-STAT TROPONIN, ED    EKG  EKG Interpretation None       Radiology Dg Chest  2 View  Result Date: 05/27/2017 CLINICAL DATA:  Chest pain EXAM: CHEST  2 VIEW COMPARISON:  07/01/2010 FINDINGS: Linear scarring or atelectasis in the right mid lung and left base. No acute consolidation or pleural effusion. Normal cardiomediastinal silhouette with atherosclerosis. No pneumothorax. Mild anterior wedging of a midthoracic vertebra. IMPRESSION: Linear atelectasis or scar within the bilateral lower lungs. No acute infiltrate or edema. Electronically Signed   By: Jasmine PangKim  Fujinaga M.D.   On: 05/27/2017 23:47    Procedures Procedures (including critical care time)  Medications Ordered in ED Medications - No data to display   Initial  Impression / Assessment and Plan / ED Course  I have reviewed the triage vital signs and the nursing notes.  Pertinent labs & imaging results that were available during my care of the patient were reviewed by me and considered in my medical decision making (see chart for details).     Patient remains chest pain free. Delta troponin pending. Patient signed out to Dr. Jeraldine LootsLockwood pending completion of testing.   Final Clinical Impressions(s) / ED Diagnoses   Final diagnoses:  None    New Prescriptions New Prescriptions   No medications on file     Felicie MornSmith, Khaiden Segreto, NP 05/28/17 [id number]    Gerhard MunchLockwood, Robert, MD 05/28/17 [phone]

## 2017-05-28 LAB — I-STAT TROPONIN, ED: TROPONIN I, POC: 0 ng/mL (ref 0.00–0.08)

## 2017-05-28 NOTE — Discharge Instructions (Signed)
As discussed, your evaluation today has been largely reassuring.  But, it is important that you monitor your condition carefully, and do not hesitate to return to the ED if you develop new, or concerning changes in your condition. ? ?Otherwise, please follow-up with your physician for appropriate ongoing care. ? ?

## 2017-05-28 NOTE — ED Notes (Signed)
Pt denies pain. Steady on feet. Son with pt. States understanding of dc instructions. Pt requesting to walk out with son. VSS.

## 2019-09-30 DIAGNOSIS — Z131 Encounter for screening for diabetes mellitus: Secondary | ICD-10-CM | POA: Diagnosis not present

## 2019-09-30 DIAGNOSIS — R5383 Other fatigue: Secondary | ICD-10-CM | POA: Diagnosis not present

## 2019-09-30 DIAGNOSIS — E559 Vitamin D deficiency, unspecified: Secondary | ICD-10-CM | POA: Diagnosis not present

## 2019-09-30 DIAGNOSIS — Z1159 Encounter for screening for other viral diseases: Secondary | ICD-10-CM | POA: Diagnosis not present

## 2019-09-30 DIAGNOSIS — Z0000 Encounter for general adult medical examination without abnormal findings: Secondary | ICD-10-CM | POA: Diagnosis not present

## 2019-09-30 DIAGNOSIS — I10 Essential (primary) hypertension: Secondary | ICD-10-CM | POA: Diagnosis not present

## 2019-09-30 DIAGNOSIS — R0602 Shortness of breath: Secondary | ICD-10-CM | POA: Diagnosis not present

## 2019-10-07 DIAGNOSIS — N183 Chronic kidney disease, stage 3 unspecified: Secondary | ICD-10-CM | POA: Diagnosis not present

## 2019-10-07 DIAGNOSIS — E789 Disorder of lipoprotein metabolism, unspecified: Secondary | ICD-10-CM | POA: Diagnosis not present

## 2019-10-07 DIAGNOSIS — R0602 Shortness of breath: Secondary | ICD-10-CM | POA: Diagnosis not present

## 2019-10-07 DIAGNOSIS — I10 Essential (primary) hypertension: Secondary | ICD-10-CM | POA: Diagnosis not present

## 2019-10-26 ENCOUNTER — Observation Stay (HOSPITAL_BASED_OUTPATIENT_CLINIC_OR_DEPARTMENT_OTHER): Payer: Medicare Other

## 2019-10-26 ENCOUNTER — Observation Stay (HOSPITAL_COMMUNITY)
Admission: EM | Admit: 2019-10-26 | Discharge: 2019-10-26 | Disposition: A | Discharge status: Home / Self Care | Payer: Medicare Other | Attending: Internal Medicine | Admitting: Internal Medicine

## 2019-10-26 ENCOUNTER — Other Ambulatory Visit: Payer: Self-pay

## 2019-10-26 ENCOUNTER — Encounter (HOSPITAL_COMMUNITY): Payer: Self-pay | Admitting: Emergency Medicine

## 2019-10-26 DIAGNOSIS — Z79899 Other long term (current) drug therapy: Secondary | ICD-10-CM | POA: Insufficient documentation

## 2019-10-26 DIAGNOSIS — K625 Hemorrhage of anus and rectum: Principal | ICD-10-CM | POA: Diagnosis present

## 2019-10-26 DIAGNOSIS — I361 Nonrheumatic tricuspid (valve) insufficiency: Secondary | ICD-10-CM | POA: Diagnosis not present

## 2019-10-26 DIAGNOSIS — K922 Gastrointestinal hemorrhage, unspecified: Secondary | ICD-10-CM | POA: Diagnosis not present

## 2019-10-26 DIAGNOSIS — I10 Essential (primary) hypertension: Secondary | ICD-10-CM | POA: Diagnosis not present

## 2019-10-26 DIAGNOSIS — Z20822 Contact with and (suspected) exposure to covid-19: Secondary | ICD-10-CM | POA: Diagnosis not present

## 2019-10-26 DIAGNOSIS — I351 Nonrheumatic aortic (valve) insufficiency: Secondary | ICD-10-CM | POA: Diagnosis not present

## 2019-10-26 DIAGNOSIS — R9431 Abnormal electrocardiogram [ECG] [EKG]: Secondary | ICD-10-CM | POA: Diagnosis not present

## 2019-10-26 DIAGNOSIS — Z03818 Encounter for observation for suspected exposure to other biological agents ruled out: Secondary | ICD-10-CM | POA: Diagnosis not present

## 2019-10-26 LAB — COMPREHENSIVE METABOLIC PANEL
ALT: 14 U/L (ref 0–44)
ALT: 15 U/L (ref 0–44)
AST: 20 U/L (ref 15–41)
AST: 23 U/L (ref 15–41)
Albumin: 4.4 g/dL (ref 3.5–5.0)
Albumin: 4.9 g/dL (ref 3.5–5.0)
Alkaline Phosphatase: 79 U/L (ref 38–126)
Alkaline Phosphatase: 86 U/L (ref 38–126)
Anion gap: 9 (ref 5–15)
Anion gap: 12 (ref 5–15)
BUN: 13 mg/dL (ref 8–23)
BUN: 13 mg/dL (ref 8–23)
CO2: 25 mmol/L (ref 22–32)
CO2: 25 mmol/L (ref 22–32)
Calcium: 9.7 mg/dL (ref 8.9–10.3)
Calcium: 9.9 mg/dL (ref 8.9–10.3)
Chloride: 105 mmol/L (ref 98–111)
Chloride: 103 mmol/L (ref 98–111)
Creatinine, Ser: 0.95 mg/dL (ref 0.44–1.00)
Creatinine, Ser: 0.93 mg/dL (ref 0.44–1.00)
GFR calc Af Amer: >60 mL/min (ref >60)
GFR calc Af Amer: >60 mL/min (ref >60)
GFR calc non Af Amer: 54 mL/min — ABNORMAL LOW (ref >60)
GFR calc non Af Amer: 56 mL/min — ABNORMAL LOW (ref >60)
Glucose, Bld: 114 mg/dL — ABNORMAL HIGH (ref 70–99)
Glucose, Bld: 115 mg/dL — ABNORMAL HIGH (ref 70–99)
Potassium: 3.8 mmol/L (ref 3.5–5.1)
Potassium: 3.9 mmol/L (ref 3.5–5.1)
Sodium: 139 mmol/L (ref 135–145)
Sodium: 140 mmol/L (ref 135–145)
Total Bilirubin: 1 mg/dL (ref 0.3–1.2)
Total Bilirubin: 1.4 mg/dL — ABNORMAL HIGH (ref 0.3–1.2)
Total Protein: 7.7 g/dL (ref 6.5–8.1)
Total Protein: 8.1 g/dL (ref 6.5–8.1)

## 2019-10-26 LAB — CBC
HCT: 45 % (ref 36.0–46.0)
HCT: 48.1 % — ABNORMAL HIGH (ref 36.0–46.0)
HCT: 44.2 % (ref 36.0–46.0)
Hemoglobin: 14.3 g/dL (ref 12.0–15.0)
Hemoglobin: 15 g/dL (ref 12.0–15.0)
Hemoglobin: 14.2 g/dL (ref 12.0–15.0)
MCH: 28.9 pg (ref 26.0–34.0)
MCH: 28.7 pg (ref 26.0–34.0)
MCH: 28.7 pg (ref 26.0–34.0)
MCHC: 31.8 g/dL (ref 30.0–36.0)
MCHC: 31.2 g/dL (ref 30.0–36.0)
MCHC: 32.1 g/dL (ref 30.0–36.0)
MCV: 90.9 fL (ref 80.0–100.0)
MCV: 92 fL (ref 80.0–100.0)
MCV: 89.5 fL (ref 80.0–100.0)
Platelets: 179 10*3/uL (ref 150–400)
Platelets: 199 10*3/uL (ref 150–400)
Platelets: 178 10*3/uL (ref 150–400)
RBC: 4.95 MIL/uL (ref 3.87–5.11)
RBC: 5.23 MIL/uL — ABNORMAL HIGH (ref 3.87–5.11)
RBC: 4.94 MIL/uL (ref 3.87–5.11)
RDW: 14 % (ref 11.5–15.5)
RDW: 13.9 % (ref 11.5–15.5)
RDW: 13.9 % (ref 11.5–15.5)
WBC: 8 10*3/uL (ref 4.0–10.5)
WBC: 7.8 10*3/uL (ref 4.0–10.5)
WBC: 7.9 10*3/uL (ref 4.0–10.5)
nRBC: 0 % (ref 0.0–0.2)
nRBC: 0 % (ref 0.0–0.2)
nRBC: 0 % (ref 0.0–0.2)

## 2019-10-26 LAB — TYPE AND SCREEN
ABO/RH(D): B POS
Antibody Screen: NEGATIVE

## 2019-10-26 LAB — ECHOCARDIOGRAM COMPLETE
Height: 63 in
Weight: 2320 oz

## 2019-10-26 LAB — ABO/RH: ABO/RH(D): B POS

## 2019-10-26 LAB — RESPIRATORY PANEL BY RT PCR (FLU A&B, COVID)
Influenza A by PCR: NEGATIVE
Influenza B by PCR: NEGATIVE
SARS Coronavirus 2 by RT PCR: NEGATIVE

## 2019-10-26 LAB — POC OCCULT BLOOD, ED: Fecal Occult Bld: POSITIVE — AB

## 2019-10-26 MED ORDER — ACETAMINOPHEN 325 MG PO TABS: 650.0000 mg | ORAL_TABLET | Freq: Four times a day (QID) | ORAL | Status: DC | PRN

## 2019-10-26 MED ORDER — ACETAMINOPHEN 650 MG RE SUPP: 650.0000 mg | Freq: Four times a day (QID) | RECTAL | Status: DC | PRN

## 2019-10-26 MED ORDER — HYDRALAZINE HCL 20 MG/ML IJ SOLN
5.0000 mg | Freq: Four times a day (QID) | INTRAMUSCULAR | Status: DC | PRN
  Administered 2019-10-26: 05:00:00 5 mg via INTRAVENOUS
  Filled 2019-10-26: qty 1

## 2019-10-26 MED ORDER — SODIUM CHLORIDE 0.9 % IV SOLN: INTRAVENOUS | Status: AC

## 2019-10-26 NOTE — Progress Notes (Signed)
Patient given discharge instructions, and verbalized an understanding of all discharge instructions.  Patient agrees with discharge plan, and is being discharged in stable medical condition.  Patient given transportation via wheelchair.  Shloma Roggenkamp RN 

## 2019-10-26 NOTE — Progress Notes (Signed)
  Echocardiogram 2D Echocardiogram has been performed.  Leta Jungling M 10/26/2019, 11:33 AM

## 2019-10-26 NOTE — ED Provider Notes (Signed)
[facility]-EMERGENCY DEPT Provider Note   CSN: [id number] Arrival date & time: 10/26/19  0207     History Chief Complaint  Patient presents with  . Rectal Bleeding    Latasha Maxwell is a 86 y.o. female.  The history is provided by the patient and medical records.  Rectal Bleeding    86 year old female with history of hypertension and pancreatitis, presenting to the ED with rectal bleeding.  This just began last evening when going to the bathroom.  States it appeared to be bright red blood with some small intermixed clots.  States this is never happened to her before.  She has felt some continued oozing so has been wearing a pad, continues having small amounts of blood/streaking.  She is not currently on anticoagulation.  Reports having colonoscopy "years ago" that was normal aside from a few polyps.  She denies any current abdominal pain, nausea, vomiting, or diarrhea.  Past Medical History:  Diagnosis Date  . Hypertension   . Pancreatitis     There are no problems to display for this patient.   Past Surgical History:  Procedure Laterality Date  . ABDOMINAL HYSTERECTOMY       OB History   No obstetric history on file.     History reviewed. No pertinent family history.  Social History   Tobacco Use  . Smoking status: Never Smoker  . Smokeless tobacco: Never Used  Substance Use Topics  . Alcohol use: No  . Drug use: No    Home Medications Prior to Admission medications   Medication Sig Start Date End Date Taking? Authorizing Provider  hydrochlorothiazide (HYDRODIURIL) 25 MG tablet Take 25 mg by mouth daily.    [provider]  metoprolol tartrate (LOPRESSOR) 100 MG tablet Take 100 mg by mouth every evening.    [provider]  metoprolol tartrate (LOPRESSOR) 50 MG tablet Take 50 mg by mouth every morning.    [provider]    Allergies    Patient has no known allergies.  Review of Systems   Review of  Systems  Gastrointestinal: Positive for hematochezia.  All other systems reviewed and are negative.   Physical Exam Updated Vital Signs BP (!) 213/104 (BP Location: Right Arm)   Pulse 99   Temp 97.6 F (36.4 C) (Oral)   Resp 17   Ht 5\' 3"  (1.6 m)   Wt 65.8 kg   SpO2 96%   BMI 25.69 kg/m   Physical Exam Vitals and nursing note reviewed. Exam conducted with a chaperone present.  Constitutional:      Appearance: She is well-developed.  HENT:     Head: Normocephalic and atraumatic.  Eyes:     Conjunctiva/sclera: Conjunctivae normal.     Pupils: Pupils are equal, round, and reactive to light.  Cardiovascular:     Rate and Rhythm: Normal rate and regular rhythm.     Heart sounds: Normal heart sounds.  Pulmonary:     Effort: Pulmonary effort is normal. No respiratory distress.     Breath sounds: Normal breath sounds. No rhonchi.  Abdominal:     General: Bowel sounds are normal.     Palpations: Abdomen is soft.  Genitourinary:    Comments: Exam chaperoned by NT Dried blood surrounding rectal, there is bright red blood noted on DRE, no internal masses noted, chronic appearing external hemorrhoids without active bleeding or signs of thrombosis, no active oozing of blood from rectum noted Musculoskeletal:  General: Normal range of motion.     Cervical back: Normal range of motion.  Skin:    General: Skin is warm and dry.  Neurological:     Mental Status: She is alert and oriented to person, place, and time.     ED Results / Procedures / Treatments   Labs (all labs ordered are listed, but only abnormal results are displayed) Labs Reviewed  COMPREHENSIVE METABOLIC PANEL - Abnormal; Notable for the following components:      Result Value   Glucose, Bld 114 (*)    GFR calc non Af Amer 54 (*)    All other components within normal limits  POC OCCULT BLOOD, ED - Abnormal; Notable for the following components:   Fecal Occult Bld POSITIVE (*)    All other components  within normal limits  RESPIRATORY PANEL BY RT PCR (FLU A&B, COVID)  CBC  TYPE AND SCREEN  ABO/RH    EKG None  Radiology No results found.  Procedures Procedures (including critical care time)  Medications Ordered in ED Medications  hydrALAZINE (APRESOLINE) injection 5 mg (has no administration in time range)    ED Course  I have reviewed the triage vital signs and the nursing notes.  Pertinent labs & imaging results that were available during my care of the patient were reviewed by me and considered in my medical decision making (see chart for details).    MDM Rules/Calculators/A&P  86 year old female presenting to the ED with rectal bleeding that began last evening.  She has not had any associated abdominal pain.  Bleeding has been bright red in color.  She is not currently on anticoagulation.  Patient is afebrile and nontoxic in appearance, hemodynamically stable.  Abdomen is soft and benign.  She does have bright red blood on digital rectal exam, no masses or internal hemorrhoids noted.  Labs are grossly reassuring with stable hemoglobin of 14.3.  Did previously see Dr. Paulita Fujita with [facility] but no recent follow-up in about 10 years.  Suspect this is likely diverticular given bright red color and lack of pain.  Will plan to admit to hospitalist for observation and GI evaluation in the AM.  Discussed with hospitalist, Dr. Maudie Mercury-- will admit for ongoing care.  Final Clinical Impression(s) / ED Diagnoses Final diagnoses:  Acute lower GI bleeding    Rx / DC Orders ED Discharge Orders    None       Larene Pickett, PA-C 10/26/19 1610    Merryl Hacker, MD 10/26/19 [phone]

## 2019-10-26 NOTE — Consult Note (Signed)
Referring Provider: Dr. Margo Aye Primary Care Physician:  Knox Royalty, MD Primary Gastroenterologist:  Dr. Dulce Sellar  Reason for Consultation:  GI bleed  HPI: Latasha Maxwell is a 86 y.o. female with reported history of pancreatitis who had one episode of rectal bleeding this morning and reportedly has had black stools for the past week. Heme positive stool. Denies abdominal pain, nausea, vomiting, hematochezia. Denies bleeding since admit. Colonoscopy years ago but records not found. EGD in 2011 that showed a hiatal hernia. Wants to eat. Complains of headache and sinus issues.  Past Medical History:  Diagnosis Date  . Hypertension   . Pancreatitis     Past Surgical History:  Procedure Laterality Date  . ABDOMINAL HYSTERECTOMY      Prior to Admission medications   Medication Sig Start Date End Date Taking? Authorizing Provider  metoprolol tartrate (LOPRESSOR) 100 MG tablet Take 50-100 mg by mouth See admin instructions. 50 MG in the AM and 100 MG in the evening   Yes [provider]    Scheduled Meds: Continuous Infusions: PRN Meds:.acetaminophen **OR** acetaminophen, hydrALAZINE  Allergies as of 10/26/2019  . (No Known Allergies)    Family History  Problem Relation Age of Onset  . CAD Mother   . Diabetes Father     Social History   Socioeconomic History  . Marital status: Divorced    Spouse name: Not on file  . Number of children: Not on file  . Years of education: Not on file  . Highest education level: Not on file  Occupational History  . Not on file  Tobacco Use  . Smoking status: Never Smoker  . Smokeless tobacco: Never Used  Substance and Sexual Activity  . Alcohol use: No  . Drug use: No  . Sexual activity: Not on file  Other Topics Concern  . Not on file  Social History Narrative  . Not on file   Social Determinants of Health   Financial Resource Strain:   . Difficulty of Paying Living Expenses: Not on file  Food Insecurity:   . Worried  About Programme researcher, broadcasting/film/video in the Last Year: Not on file  . Ran Out of Food in the Last Year: Not on file  Transportation Needs:   . Lack of Transportation (Medical): Not on file  . Lack of Transportation (Non-Medical): Not on file  Physical Activity:   . Days of Exercise per Week: Not on file  . Minutes of Exercise per Session: Not on file  Stress:   . Feeling of Stress : Not on file  Social Connections:   . Frequency of Communication with Friends and Family: Not on file  . Frequency of Social Gatherings with Friends and Family: Not on file  . Attends Religious Services: Not on file  . Active Member of Clubs or Organizations: Not on file  . Attends Banker Meetings: Not on file  . Marital Status: Not on file  Intimate Partner Violence:   . Fear of Current or Ex-Partner: Not on file  . Emotionally Abused: Not on file  . Physically Abused: Not on file  . Sexually Abused: Not on file    Review of Systems: All negative except as stated above in HPI.  Physical Exam: Vital signs: Vitals:   10/26/19 0517 10/26/19 1356  BP: (!) 161/84 (!) 147/81  Pulse: 91 84  Resp:  16  Temp: 98 F (36.7 C) 98.4 F (36.9 C)  SpO2: 97% 97%   Last BM Date:  10/25/19 General:   Elderly, lethargic, thin, pleasant, no acute distress  Head: normocephalic, atraumatic Eyes: anicteric sclera ENT: oropharynx clear Neck: supple, nontender Lungs:  Clear throughout to auscultation.   No wheezes, crackles, or rhonchi. No acute distress. Heart:  Regular rate and rhythm; no murmurs, clicks, rubs,  or gallops. Abdomen: soft, nontender, nondistended, +BS  Rectal:  Deferred Ext: no edema  GI:  Lab Results: Recent Labs    10/26/19 0244 10/26/19 0440 10/26/19 1147  WBC 8.0 7.8 7.9  HGB 14.3 15.0 14.2  HCT 45.0 48.1* 44.2  PLT 179 199 178   BMET Recent Labs    10/26/19 0244 10/26/19 0440  NA 139 140  K 3.8 3.9  CL 105 103  CO2 25 25  GLUCOSE 114* 115*  BUN 13 13  CREATININE  0.95 0.93  CALCIUM 9.7 9.9   LFT Recent Labs    10/26/19 0440  PROT 8.1  ALBUMIN 4.9  AST 23  ALT 15  ALKPHOS 86  BILITOT 1.4*   PT/INR No results for input(s): LABPROT, INR in the last 72 hours.    Impression/Plan: Rectal bleeding with normal Hgb. No signs of ongoing bleeding and would manage conservatively without pursuing endoscopic procedures. Start solid food and ok to go home today from GI standpoint. F/U with GI prn. Will sign off. Defer her request for sinus meds to Dr. Nevada Crane.    LOS: 0 days   Lear Ng  10/26/2019, 4:09 PM  Questions please call [phone]

## 2019-10-26 NOTE — Progress Notes (Signed)
Latasha Maxwell  is a 86 y.o. female, w hypertension, h/o pancreatitis presented with bleeding x1 on 10/25/2019 morning.  Pt denies vaginal bleeding. Pt notes black stool x 1 week. Pt denies fever, chills, cough, cp, palp, sob, n/v, abd pain, diarrhea.  Last colonoscopy was more than 10 years ago by Dr. Dulce Sellar, [facility].  History of GI cancer in her son who is deceased.  2019/11/23: Seen and examined.  She denies any recurrent rectal bleed this morning.  No abdominal pain or nausea.  No new complaints.  Hemoglobin stable at 15.  [facility] consulted.  Dr. Bosie Clos will see in consultation.  Please refer to H&P dictated by my partner Dr. Selena Batten on 2019/11/23 for further details of the assessment and plan.

## 2019-10-26 NOTE — Discharge Summary (Signed)
Discharge Summary  Latasha Maxwell ZOX:[id number] DOB: [birth year]  PCP: Kristie Cowman, MD  Admit date: 10/26/2019 Discharge date: 10/26/2019  Time spent: 35 minutes   Recommendations for Outpatient Follow-up:  1. Follow up with your PCP 1-2 weeks. 2. Follow up with [facility], Dr. Paulita Fujita, as needed.  3. Take your medications as prescribed by your provider.  Discharge Diagnoses:  Active Hospital Problems   Diagnosis Date Noted  . Rectal bleeding 10/26/2019  . Hypertension 10/26/2019    Resolved Hospital Problems  No resolved problems to display.    Discharge Condition: Stable.   Diet recommendation: Resume previous diet.  Vitals:   10/26/19 0517 10/26/19 1356  BP: (!) 161/84 (!) 147/81  Pulse: 91 84  Resp:  16  Temp: 98 F (36.7 C) 98.4 F (36.9 C)  SpO2: 97% 97%    History of present illness:  Latasha a86 Maxwell,with hypertension, h/o pancreatitis presented with bleeding x1 on 10/25/2019 morning.  Denies vaginal bleeding.  Notes black stool x 1 week.  Denies fever, chills, cough, cp, palp, sob, n/v, abd pain, diarrhea.  Last colonoscopy was more than 10 years ago by Dr. Paulita Fujita, [place]  10/26/19: Seen and examined.  She denies any recurrent rectal bleed.  No abdominal pain or nausea.  No new complaints.  Hemoglobin stable at 15 K.  Seen by [facility], Dr. Michail Sermon, no planned procedure. OK to discharge today from a GI standpoint.  Hemoglobin is stable and trending up to 15 K.  No recurrent GI bleed.  Denies dizziness.  Vital signs are stable.  Will need to follow up with her PCP in 1-2 weeks.  Follow up with GI as needed.  Hospital Course:  Principal Problem:   Rectal bleeding Active Problems:   Hypertension   Resolved Rectal bleeding with normal hemoglobin Hg stable at 15K No recurrent GI bleed Seen by GI, no planned endoscopy.  Ok to [employer] home today Follow up with PCP or GI as needed  Abnormal EKG Possible old anterior infarct on admission 12  lead EKG. Denies anginal symptoms 2D echo done on 10/26/19: 1. The average left ventricular global longitudinal strain is -18.9 %.  2. Left ventricular ejection fraction, by visual estimation, is 60 to  65%. The left ventricle has normal function. There is mildly increased  left ventricular hypertrophy of the basal septum.  3. The left ventricle has no regional wall motion abnormalities.  4. Left ventricular diastolic parameters are consistent with Grade I  diastolic dysfunction (impaired relaxation).  5. Global right ventricle has normal systolic function.The right  ventricular size is normal. No increase in right ventricular wall  thickness.  6. Left atrial size was normal.  7. Right atrial size was normal.  8. The mitral valve is normal in structure. Mild mitral annular  calcification.mitral valve regurgitation. No evidence of mitral stenosis.  9. The tricuspid valve is normal in structure. Tricuspid valve  regurgitation is mild.  10. The aortic valve is tricuspid. Moderate aortic valve annular  calcification.Aortic valve regurgitation is mild to moderate. Moderate  aortic valve sclerosis/calcification without any evidence of aortic  stenosis.  11. The pulmonic valve was normal in structure. Pulmonic valve  regurgitation is mild.  12. Normal pulmonary artery systolic pressure.  13. The inferior vena cava is normal in size with greater than 50%  respiratory variability, suggesting right atrial pressure of 3 mmHg.   Hypertension Restart home Metoprolol Follow up with your PCP  Isolated hyperbilirubinemia T bili 1.4 from 1.0  on the same day (10/26/19) Alk phos, AST, ALT normal Follow up with your PCP in 1-2 weeks     Code Status:  FULL CODE per patient, notified   Admission status: Observation:    Discharge Exam: BP (!) 147/81 (BP Location: Right Arm)   Pulse 84   Temp 98.4 F (36.9 C) (Oral)   Resp 16   Ht _0  (1.6 m)   Wt 65.8 kg   SpO2 97%   BMI  25.69 kg/m  . General: 86 y.o. year-old female well developed well nourished in no acute distress.  Alert and oriented x3. . Cardiovascular: Regular rate and rhythm with no rubs or gallops.  No thyromegaly or JVD noted.   Marland Kitchen Respiratory: Clear to auscultation with no wheezes or rales. Good inspiratory effort. . Abdomen: Soft nontender nondistended with normal bowel sounds x4 quadrants. . Musculoskeletal: No lower extremity edema. 2/4 pulses in all 4 extremities. Marland Kitchen Psychiatry: Mood is appropriate for condition and setting  Discharge Instructions You were cared for by a hospitalist during your hospital stay. If you have any questions about your discharge medications or the care you received while you were in the hospital after you are discharged, you can call the unit and asked to speak with the hospitalist on call if the hospitalist that took care of you is not available. Once you are discharged, your primary care physician will handle any further medical issues. Please note that NO REFILLS for any discharge medications will be authorized once you are discharged, as it is imperative that you return to your primary care physician (or establish a relationship with a primary care physician if you do not have one) for your aftercare needs so that they can reassess your need for medications and monitor your lab values.   Allergies as of 10/26/2019   No Known Allergies     Medication List    TAKE these medications   metoprolol tartrate 100 MG tablet Commonly known as: LOPRESSOR Take 50-100 mg by mouth See admin instructions. 50 MG in the AM and 100 MG in the evening      No Known Allergies Follow-up Information    Kristie Cowman, MD. Call in 1 day(s).   Specialty: Family Medicine Why: Please call for a post hospital follow up appointment. Contact information: [place] [address] [phone]        Arta Silence, MD. Call in 1 day(s).   Specialty: Gastroenterology Why:  Please call as needed for a post hospital follow up appointment. Contact information: [address] [place] [address] [phone]            The results of significant diagnostics from this hospitalization (including imaging, microbiology, ancillary and laboratory) are listed below for reference.    Significant Diagnostic Studies: ECHOCARDIOGRAM COMPLETE  Result Date: 10/26/2019   ECHOCARDIOGRAM REPORT   Patient Name:   PEBBLE BOTKIN [facility] Date of Exam: 10/26/2019 Medical Rec #:  [id number]       Height:       63.0 in Accession #:    [id number]      Weight:       145.0 lb Date of Birth:  Jun 13, 1931       BSA:          1.69 m Patient Age:    19 years        BP:           161/84 mmHg Patient Gender: F  HR:           72 bpm. Exam Location:  Inpatient Procedure: 2D Echo and Strain Analysis Indications:    Abnormal ECG 794.31 / R94.31  History:        Patient has no prior history of Echocardiogram examinations.                 Risk Factors:Hypertension.  Sonographer:    Darlina Sicilian RDCS Referring Phys: [place]  1. The average left ventricular global longitudinal strain is -18.9 %.  2. Left ventricular ejection fraction, by visual estimation, is 60 to 65%. The left ventricle has normal function. There is mildly increased left ventricular hypertrophy of the basal septum.  3. The left ventricle has no regional wall motion abnormalities.  4. Left ventricular diastolic parameters are consistent with Grade I diastolic dysfunction (impaired relaxation).  5. Global right ventricle has normal systolic function.The right ventricular size is normal. No increase in right ventricular wall thickness.  6. Left atrial size was normal.  7. Right atrial size was normal.  8. The mitral valve is normal in structure. Mild mitral annular calcification.mitral valve regurgitation. No evidence of mitral stenosis.  9. The tricuspid valve is normal in structure. Tricuspid valve  regurgitation is mild. 10. The aortic valve is tricuspid. Moderate aortic valve annular calcification.Aortic valve regurgitation is mild to moderate. Moderate aortic valve sclerosis/calcification without any evidence of aortic stenosis. 11. The pulmonic valve was normal in structure. Pulmonic valve regurgitation is mild. 12. Normal pulmonary artery systolic pressure. 13. The inferior vena cava is normal in size with greater than 50% respiratory variability, suggesting right atrial pressure of 3 mmHg. FINDINGS  Left Ventricle: Left ventricular ejection fraction, by visual estimation, is 60 to 65%. The left ventricle has normal function. The average left ventricular global longitudinal strain is -18.9 %. The left ventricle has no regional wall motion abnormalities. There is mildly increased left ventricular hypertrophy of the basal septum. Left ventricular diastolic parameters are consistent with Grade I diastolic dysfunction (impaired relaxation). Right Ventricle: The right ventricular size is normal. No increase in right ventricular wall thickness. Global RV systolic function is has normal systolic function. The tricuspid regurgitant velocity is 2.33 m/s, and with an assumed right atrial pressure  of 3 mmHg, the estimated right ventricular systolic pressure is normal at 24.7 mmHg. Left Atrium: Left atrial size was normal in size. Right Atrium: Right atrial size was normal in size Pericardium: There is no evidence of pericardial effusion. Mitral Valve: The mitral valve is normal in structure. Mild mitral annular calcification. Trivial mitral valve regurgitation. No evidence of mitral valve stenosis by observation. Tricuspid Valve: The tricuspid valve is normal in structure. Tricuspid valve regurgitation is mild. Aortic Valve: The aortic valve is tricuspid. Aortic valve regurgitation is mild to moderate. Aortic regurgitation PHT measures 336 msec. Moderate aortic valve sclerosis/calcification is present, without any  evidence of aortic stenosis. Moderate aortic valve annular calcification. Pulmonic Valve: The pulmonic valve was normal in structure. Pulmonic valve regurgitation is mild. Pulmonic regurgitation is mild. Aorta: The aortic root, ascending aorta and aortic arch are all structurally normal, with no evidence of dilitation or obstruction. Venous: The inferior vena cava is normal in size with greater than 50% respiratory variability, suggesting right atrial pressure of 3 mmHg. IAS/Shunts: No atrial level shunt detected by color flow Doppler. There is no evidence of a patent foramen ovale. No ventricular septal defect is seen or detected. There is no evidence of an  atrial septal defect.  LEFT VENTRICLE PLAX 2D LVIDd:         3.60 cm  Diastology LVIDs:         2.30 cm  LV e' lateral:   6.00 cm/s LV PW:         1.00 cm  LV E/e' lateral: 9.1 LV IVS:        1.10 cm  LV e' medial:    6.57 cm/s LVOT diam:     1.40 cm  LV E/e' medial:  8.3 LV SV:         36 ml LV SV Index:   21.07    2D Longitudinal Strain LVOT Area:     1.54 cm 2D Strain GLS (A2C):   -19.1 %                         2D Strain GLS (A3C):   -19.0 %                         2D Strain GLS (A4C):   -18.6 %                         2D Strain GLS Avg:     -18.9 % RIGHT VENTRICLE RV S prime:     12.60 cm/s TAPSE (M-mode): 2.3 cm LEFT ATRIUM             Index       RIGHT ATRIUM          Index LA diam:        3.40 cm 2.02 cm/m  RA Area:     9.18 cm LA Vol (A2C):   35.5 ml 21.05 ml/m RA Volume:   16.20 ml 9.60 ml/m LA Vol (A4C):   44.7 ml 26.50 ml/m LA Biplane Vol: 41.5 ml 24.61 ml/m  AORTIC VALVE LVOT Vmax:   76.70 cm/s LVOT Vmean:  59.300 cm/s LVOT VTI:    0.185 m AI PHT:      336 msec  AORTA Ao Root diam: 2.80 cm Ao Asc diam:  3.40 cm MITRAL VALVE                         TRICUSPID VALVE MV Area (PHT): 2.50 cm              TR Peak grad:   21.7 mmHg MV PHT:        88.16 msec            TR Vmax:        246.00 cm/s MV Decel Time: 304 msec MV E velocity: 54.40 cm/s  103  cm/s  SHUNTS MV A velocity: 112.00 cm/s 70.3 cm/s Systemic VTI:  0.18 m MV E/A ratio:  0.49        1.5       Systemic Diam: 1.40 cm  Fransico Him MD Electronically signed by Fransico Him MD Signature Date/Time: 10/26/2019/1:46:29 PM    Final     Microbiology: Recent Results (from the past 240 hour(s))  Respiratory Panel by RT PCR (Flu A&B, Covid) - Nasopharyngeal Swab     Status: None   Collection Time: 10/26/19  4:38 AM   Specimen: Nasopharyngeal Swab  Result Value Ref Range Status   SARS Coronavirus 2 by RT PCR NEGATIVE NEGATIVE Final    Comment: (NOTE) SARS-CoV-2 target nucleic acids are  NOT DETECTED. The SARS-CoV-2 RNA is generally detectable in upper respiratoy specimens during the acute phase of infection. The lowest concentration of SARS-CoV-2 viral copies this assay can detect is 131 copies/mL. A negative result does not preclude SARS-Cov-2 infection and should not be used as the sole basis for treatment or other patient management decisions. A negative result may occur with  improper specimen collection/handling, submission of specimen other than nasopharyngeal swab, presence of viral mutation(s) within the areas targeted by this assay, and inadequate number of viral copies (<131 copies/mL). A negative result must be combined with clinical observations, patient history, and epidemiological information. The expected result is Negative. Fact Sheet for Patients:  PinkCheek.be Fact Sheet for Healthcare Providers:  GravelBags.it This test is not yet ap proved or cleared by the [place] FDA and  has been authorized for detection and/or diagnosis of SARS-CoV-2 by FDA under an Emergency Use Authorization (EUA). This EUA will remain  in effect (meaning this test can be used) for the duration of the COVID-19 declaration under Section 564(b)(1) of the Act, 21 U.S.C. section 360bbb-3(b)(1), unless the authorization is  terminated or revoked sooner.    Influenza A by PCR NEGATIVE NEGATIVE Final   Influenza B by PCR NEGATIVE NEGATIVE Final    Comment: (NOTE) The Xpert Xpress SARS-CoV-2/FLU/RSV assay is intended as an aid in  the diagnosis of influenza from Nasopharyngeal swab specimens and  should not be used as a sole basis for treatment. Nasal washings and  aspirates are unacceptable for Xpert Xpress SARS-CoV-2/FLU/RSV  testing. Fact Sheet for Patients: PinkCheek.be Fact Sheet for Healthcare Providers: GravelBags.it This test is not yet approved or cleared by the [place] FDA and  has been authorized for detection and/or diagnosis of SARS-CoV-2 by  FDA under an Emergency Use Authorization (EUA). This EUA will remain  in effect (meaning this test can be used) for the duration of the  Covid-19 declaration under Section 564(b)(1) of the Act, 21  U.S.C. section 360bbb-3(b)(1), unless the authorization is  terminated or revoked. Performed at [facility], [place] [address], [place], [place] [address]      Labs: Basic Metabolic Panel: Recent Labs  Lab 10/26/19 0244 10/26/19 0440  NA 139 140  K 3.8 3.9  CL 105 103  CO2 25 25  GLUCOSE 114* 115*  BUN 13 13  CREATININE 0.95 0.93  CALCIUM 9.7 9.9   Liver Function Tests: Recent Labs  Lab 10/26/19 0244 10/26/19 0440  AST 20 23  ALT 14 15  ALKPHOS 79 86  BILITOT 1.0 1.4*  PROT 7.7 8.1  ALBUMIN 4.4 4.9   No results for input(s): LIPASE, AMYLASE in the last 168 hours. No results for input(s): AMMONIA in the last 168 hours. CBC: Recent Labs  Lab 10/26/19 0244 10/26/19 0440 10/26/19 1147  WBC 8.0 7.8 7.9  HGB 14.3 15.0 14.2  HCT 45.0 48.1* 44.2  MCV 90.9 92.0 89.5  PLT 179 199 178   Cardiac Enzymes: No results for input(s): CKTOTAL, CKMB, CKMBINDEX, TROPONINI in the last 168 hours. BNP: BNP (last 3 results) No results for input(s): BNP in the last  8760 hours.  ProBNP (last 3 results) No results for input(s): PROBNP in the last 8760 hours.  CBG: No results for input(s): GLUCAP in the last 168 hours.     Signed:  Kayleen Memos, MD [facility] 10/26/2019, 5:08 PM

## 2019-10-26 NOTE — H&P (Signed)
TRH H&P    Patient Demographics:    Latasha Maxwell, is a 86 y.o. female  MRN: [id number]  DOB - 1931/01/27  Admit Date - 10/26/2019  Referring MD/NP/PA:  Sharilyn Sites  Outpatient Primary MD for the patient is Knox Royalty, MD [facility]  Patient coming from:  home  Chief complaint- rectal bleeding   HPI:    Latasha Maxwell  is a 86 y.o. female,  w hypertension, h/o pancreatitis, c/o rectal bleeding x1 this morning.  Noted on her pad.  Pt denies vaginal bleeding. Pt notes black stool x 1 week. Pt denies fever, chills, cough, cp, palp, sob, n/v, abd pain, diarrhea.   In ED T 97.6, P 99, R 17, Bp 213/104,  Pox 96% on RA WT 65.8kg  FOBT +  Wbc 8.0, Hgb 14.3 Plt 179 Na 140, K 3.9, Bun 13, Creatinine 0.93 Ast 23, Alt 15  Pt appears to have prior EG in 06/23/2010-> negative for pud Pt unclear about colonoscopy  Pt will be admitted for rectal bleeding      Review of systems:    In addition to the HPI above,  No Fever-chills, No Headache, No changes with Vision or hearing, No problems swallowing food or Liquids, No Chest pain, Cough or Shortness of Breath, No Abdominal pain, No Nausea or Vomiting, bowel movements are regular, No Blood in stool or Urine, No dysuria, No new skin rashes or bruises, No new joints pains-aches,  No new weakness, tingling, numbness in any extremity, No recent weight gain or loss, No polyuria, polydypsia or polyphagia, No significant Mental Stressors.  All other systems reviewed and are negative.    Past History of the following :    Past Medical History:  Diagnosis Date  . Hypertension   . Pancreatitis       Past Surgical History:  Procedure Laterality Date  . ABDOMINAL HYSTERECTOMY        Social History:      Social History   Tobacco Use  . Smoking status: Never Smoker  . Smokeless tobacco: Never Used  Substance Use Topics  . Alcohol  use: No       Family History :     Family History  Problem Relation Age of Onset  . CAD Mother   . Diabetes Father        Home Medications:   Prior to Admission medications   Medication Sig Start Date End Date Taking? Authorizing Provider  metoprolol tartrate (LOPRESSOR) 100 MG tablet Take 50-100 mg by mouth See admin instructions. 50 MG in the AM and 100 MG in the evening   Yes [provider]     Allergies:    No Known Allergies   Physical Exam:   Vitals  Blood pressure (!) 161/84, pulse 91, temperature 98 F (36.7 C), resp. rate 18, height 5\' 3"  (1.6 m), weight 65.8 kg, SpO2 97 %.  1.  General: axoxo3  2. Psychiatric: euthymic  3. Neurologic: nonfocal  4. HEENMT:  Anicteric, pink conjunctiva, pupils 1.13mm symmetric, direct, consensual  near intact Neck: no jvd  5. Respiratory : CTAB  6. Cardiovascular : rrr s1, s2, no m/g/r  7. Gastrointestinal:  Abd: soft, nt, nd, +bs  8. Skin:  Ext: no c/c/e, no rash  9.Musculoskeletal:  Good ROM    Data Review:    CBC Recent Labs  Lab 10/26/19 0244 10/26/19 0440  WBC 8.0 7.8  HGB 14.3 15.0  HCT 45.0 48.1*  PLT 179 199  MCV 90.9 92.0  MCH 28.9 28.7  MCHC 31.8 31.2  RDW 14.0 13.9   ------------------------------------------------------------------------------------------------------------------  Results for orders placed or performed during the hospital encounter of 10/26/19 (from the past 48 hour(s))  Comprehensive metabolic panel     Status: Abnormal   Collection Time: 10/26/19  2:44 AM  Result Value Ref Range   Sodium 139 135 - 145 mmol/L   Potassium 3.8 3.5 - 5.1 mmol/L   Chloride 105 98 - 111 mmol/L   CO2 25 22 - 32 mmol/L   Glucose, Bld 114 (H) 70 - 99 mg/dL   BUN 13 8 - 23 mg/dL   Creatinine, Ser 1.910.95 0.44 - 1.00 mg/dL   Calcium 9.7 8.9 - 47.810.3 mg/dL   Total Protein 7.7 6.5 - 8.1 g/dL   Albumin 4.4 3.5 - 5.0 g/dL   AST 20 15 - 41 U/L   ALT 14 0 - 44 U/L   Alkaline  Phosphatase 79 38 - 126 U/L   Total Bilirubin 1.0 0.3 - 1.2 mg/dL   GFR calc non Af Amer 54 (L) >60 mL/min   GFR calc Af Amer >60 >60 mL/min   Anion gap 9 5 - 15    Comment: Performed at [facility], [address] [place], [place] [address]  CBC     Status: None   Collection Time: 10/26/19  2:44 AM  Result Value Ref Range   WBC 8.0 4.0 - 10.5 K/uL   RBC 4.95 3.87 - 5.11 MIL/uL   Hemoglobin 14.3 12.0 - 15.0 g/dL   HCT 13.045.0 86.536.0 - 78.446.0 %   MCV 90.9 80.0 - 100.0 fL   MCH 28.9 26.0 - 34.0 pg   MCHC 31.8 30.0 - 36.0 g/dL   RDW 69.614.0 29.511.5 - 28.415.5 %   Platelets 179 150 - 400 K/uL   nRBC 0.0 0.0 - 0.2 %    Comment: Performed at [facility], [address] [place], [place] [address]  Type and screen [facility]     Status: None   Collection Time: 10/26/19  2:44 AM  Result Value Ref Range   ABO/RH(D) B POS    Antibody Screen NEG    Sample Expiration      10/29/2019,2359 Performed at [facility], [address] [place], [place] [address]   ABO/Rh     Status: None (Preliminary result)   Collection Time: 10/26/19  2:44 AM  Result Value Ref Range   ABO/RH(D)      B POS Performed at [facility], [address] [place], [place] [address]   POC occult blood, ED     Status: Abnormal   Collection Time: 10/26/19  3:15 AM  Result Value Ref Range   Fecal Occult Bld POSITIVE (A) NEGATIVE  Respiratory Panel by RT PCR (Flu A&B, Covid) - Nasopharyngeal Swab     Status: None   Collection Time: 10/26/19  4:38 AM   Specimen: Nasopharyngeal Swab  Result Value Ref Range   SARS Coronavirus 2  by RT PCR NEGATIVE NEGATIVE    Comment: (NOTE) SARS-CoV-2 target nucleic acids are NOT DETECTED. The SARS-CoV-2 RNA is generally detectable in upper respiratoy specimens during the acute phase of infection. The lowest concentration of SARS-CoV-2 viral copies this assay can detect is 131 copies/mL. A negative  result does not preclude SARS-Cov-2 infection and should not be used as the sole basis for treatment or other patient management decisions. A negative result may occur with  improper specimen collection/handling, submission of specimen other than nasopharyngeal swab, presence of viral mutation(s) within the areas targeted by this assay, and inadequate number of viral copies (<131 copies/mL). A negative result must be combined with clinical observations, patient history, and epidemiological information. The expected result is Negative. Fact Sheet for Patients:  https://www.moore.com/ Fact Sheet for Healthcare Providers:  https://www.young.biz/ This test is not yet ap proved or cleared by the [place] FDA and  has been authorized for detection and/or diagnosis of SARS-CoV-2 by FDA under an Emergency Use Authorization (EUA). This EUA will remain  in effect (meaning this test can be used) for the duration of the COVID-19 declaration under Section 564(b)(1) of the Act, 21 U.S.C. section 360bbb-3(b)(1), unless the authorization is terminated or revoked sooner.    Influenza A by PCR NEGATIVE NEGATIVE   Influenza B by PCR NEGATIVE NEGATIVE    Comment: (NOTE) The Xpert Xpress SARS-CoV-2/FLU/RSV assay is intended as an aid in  the diagnosis of influenza from Nasopharyngeal swab specimens and  should not be used as a sole basis for treatment. Nasal washings and  aspirates are unacceptable for Xpert Xpress SARS-CoV-2/FLU/RSV  testing. Fact Sheet for Patients: https://www.moore.com/ Fact Sheet for Healthcare Providers: https://www.young.biz/ This test is not yet approved or cleared by the [place] FDA and  has been authorized for detection and/or diagnosis of SARS-CoV-2 by  FDA under an Emergency Use Authorization (EUA). This EUA will remain  in effect (meaning this test can be used) for the duration of the    Covid-19 declaration under Section 564(b)(1) of the Act, 21  U.S.C. section 360bbb-3(b)(1), unless the authorization is  terminated or revoked. Performed at [facility], [address], [place], [place] [address]   Comprehensive metabolic panel     Status: Abnormal   Collection Time: 10/26/19  4:40 AM  Result Value Ref Range   Sodium 140 135 - 145 mmol/L   Potassium 3.9 3.5 - 5.1 mmol/L   Chloride 103 98 - 111 mmol/L   CO2 25 22 - 32 mmol/L   Glucose, Bld 115 (H) 70 - 99 mg/dL   BUN 13 8 - 23 mg/dL   Creatinine, Ser 8.88 0.44 - 1.00 mg/dL   Calcium 9.9 8.9 - 28.0 mg/dL   Total Protein 8.1 6.5 - 8.1 g/dL   Albumin 4.9 3.5 - 5.0 g/dL   AST 23 15 - 41 U/L   ALT 15 0 - 44 U/L   Alkaline Phosphatase 86 38 - 126 U/L   Total Bilirubin 1.4 (H) 0.3 - 1.2 mg/dL   GFR calc non Af Amer 56 (L) >60 mL/min   GFR calc Af Amer >60 >60 mL/min   Anion gap 12 5 - 15    Comment: Performed at [facility], [address], [place], [place] [address]  CBC     Status: Abnormal   Collection Time: 10/26/19  4:40 AM  Result Value Ref Range   WBC 7.8 4.0 - 10.5 K/uL   RBC 5.23 (H) 3.87 -  5.11 MIL/uL   Hemoglobin 15.0 12.0 - 15.0 g/dL   HCT 19.3 (H) 79.0 - 24.0 %   MCV 92.0 80.0 - 100.0 fL   MCH 28.7 26.0 - 34.0 pg   MCHC 31.2 30.0 - 36.0 g/dL   RDW 97.3 53.2 - 99.2 %   Platelets 199 150 - 400 K/uL   nRBC 0.0 0.0 - 0.2 %    Comment: Performed at [facility], [address] [place], [place] [address]    Chemistries  Recent Labs  Lab 10/26/19 0244 10/26/19 0440  NA 139 140  K 3.8 3.9  CL 105 103  CO2 25 25  GLUCOSE 114* 115*  BUN 13 13  CREATININE 0.95 0.93  CALCIUM 9.7 9.9  AST 20 23  ALT 14 15  ALKPHOS 79 86  BILITOT 1.0 1.4*    ------------------------------------------------------------------------------------------------------------------  ------------------------------------------------------------------------------------------------------------------ GFR: Estimated Creatinine Clearance: 39.6 mL/min (by C-G formula based on SCr of 0.93 mg/dL). Liver Function Tests: Recent Labs  Lab 10/26/19 0244 10/26/19 0440  AST 20 23  ALT 14 15  ALKPHOS 79 86  BILITOT 1.0 1.4*  PROT 7.7 8.1  ALBUMIN 4.4 4.9   No results for input(s): LIPASE, AMYLASE in the last 168 hours. No results for input(s): AMMONIA in the last 168 hours. Coagulation Profile: No results for input(s): INR, PROTIME in the last 168 hours. Cardiac Enzymes: No results for input(s): CKTOTAL, CKMB, CKMBINDEX, TROPONINI in the last 168 hours. BNP (last 3 results) No results for input(s): PROBNP in the last 8760 hours. HbA1C: No results for input(s): HGBA1C in the last 72 hours. CBG: No results for input(s): GLUCAP in the last 168 hours. Lipid Profile: No results for input(s): CHOL, HDL, LDLCALC, TRIG, CHOLHDL, LDLDIRECT in the last 72 hours. Thyroid Function Tests: No results for input(s): TSH, T4TOTAL, FREET4, T3FREE, THYROIDAB in the last 72 hours. Anemia Panel: No results for input(s): VITAMINB12, FOLATE, FERRITIN, TIBC, IRON, RETICCTPCT in the last 72 hours.  --------------------------------------------------------------------------------------------------------------- Urine analysis:    Component Value Date/Time   COLORURINE YELLOW 07/01/2010 0006   APPEARANCEUR TURBID (A) 07/01/2010 0006   LABSPEC 1.021 07/01/2010 0006   PHURINE 5.5 07/01/2010 0006   GLUCOSEU NEGATIVE 07/01/2010 0006   HGBUR TRACE (A) 07/01/2010 0006   BILIRUBINUR MODERATE (A) 07/01/2010 0006   KETONESUR >80 (A) 07/01/2010 0006   PROTEINUR 30 (A) 07/01/2010 0006   UROBILINOGEN 1.0 07/01/2010 0006   NITRITE NEGATIVE 07/01/2010 0006   LEUKOCYTESUR LARGE (A)  07/01/2010 0006      Imaging Results:    No results found.   nsr at 70, nl axis, q in v1-3,  No st-t changes c/w ischemia   Assessment & Plan:    Principal Problem:   Rectal bleeding Active Problems:   Hypertension  Rectal bleeding Type and screen  NPO Check cbc at 12 noon Please contact [facility] , has seen [facility] in the past  Abnormal EKG Check cardiac echo Consider further w.up  Hypertension Cont Metoprolol Hydralazine 5mg  iv q6h prn sbp >160   DVT Prophylaxis-    SCDs  AM Labs Ordered, also please review Full Orders  Family Communication: Admission, patients condition and plan of care including tests being ordered have been discussed with the patient  who indicate understanding and agree with the plan and Code Status.  Code Status:  FULL CODE per patient, notified   Admission status: Observation: Based on patients clinical presentation and evaluation of above clinical data, I have made determination that  patient meets observation criteria at this time.   Time spent in minutes : 55 minutes   Jani Gravel M.D on 10/26/2019 at 5:55 AM

## 2019-10-26 NOTE — ED Triage Notes (Signed)
Pt reports seeing bright red blood when using bathroom and feeling like her pad is saturated.

## 2019-11-08 DIAGNOSIS — E789 Disorder of lipoprotein metabolism, unspecified: Secondary | ICD-10-CM | POA: Diagnosis not present

## 2019-11-08 DIAGNOSIS — I10 Essential (primary) hypertension: Secondary | ICD-10-CM | POA: Diagnosis not present

## 2019-11-08 DIAGNOSIS — N183 Chronic kidney disease, stage 3 unspecified: Secondary | ICD-10-CM | POA: Diagnosis not present

## 2019-11-08 DIAGNOSIS — E559 Vitamin D deficiency, unspecified: Secondary | ICD-10-CM | POA: Diagnosis not present

## 2021-03-13 DIAGNOSIS — I10 Essential (primary) hypertension: Secondary | ICD-10-CM | POA: Diagnosis not present

## 2021-03-13 DIAGNOSIS — E789 Disorder of lipoprotein metabolism, unspecified: Secondary | ICD-10-CM | POA: Diagnosis not present

## 2021-03-13 DIAGNOSIS — Z1159 Encounter for screening for other viral diseases: Secondary | ICD-10-CM | POA: Diagnosis not present

## 2021-03-13 DIAGNOSIS — E559 Vitamin D deficiency, unspecified: Secondary | ICD-10-CM | POA: Diagnosis not present

## 2021-03-13 DIAGNOSIS — L989 Disorder of the skin and subcutaneous tissue, unspecified: Secondary | ICD-10-CM | POA: Diagnosis not present

## 2021-03-13 DIAGNOSIS — L03115 Cellulitis of right lower limb: Secondary | ICD-10-CM | POA: Diagnosis not present

## 2021-03-27 DIAGNOSIS — Z0000 Encounter for general adult medical examination without abnormal findings: Secondary | ICD-10-CM | POA: Diagnosis not present

## 2021-03-27 DIAGNOSIS — R0602 Shortness of breath: Secondary | ICD-10-CM | POA: Diagnosis not present

## 2021-03-27 DIAGNOSIS — L989 Disorder of the skin and subcutaneous tissue, unspecified: Secondary | ICD-10-CM | POA: Diagnosis not present

## 2021-03-27 DIAGNOSIS — L03115 Cellulitis of right lower limb: Secondary | ICD-10-CM | POA: Diagnosis not present

## 2021-03-27 DIAGNOSIS — E789 Disorder of lipoprotein metabolism, unspecified: Secondary | ICD-10-CM | POA: Diagnosis not present

## 2021-03-27 DIAGNOSIS — I10 Essential (primary) hypertension: Secondary | ICD-10-CM | POA: Diagnosis not present

## 2021-03-27 DIAGNOSIS — R5383 Other fatigue: Secondary | ICD-10-CM | POA: Diagnosis not present

## 2021-04-02 DIAGNOSIS — R0602 Shortness of breath: Secondary | ICD-10-CM | POA: Diagnosis not present

## 2021-04-02 DIAGNOSIS — R5383 Other fatigue: Secondary | ICD-10-CM | POA: Diagnosis not present

## 2021-04-02 DIAGNOSIS — L989 Disorder of the skin and subcutaneous tissue, unspecified: Secondary | ICD-10-CM | POA: Diagnosis not present

## 2021-04-02 DIAGNOSIS — I10 Essential (primary) hypertension: Secondary | ICD-10-CM | POA: Diagnosis not present

## 2021-04-02 DIAGNOSIS — R7989 Other specified abnormal findings of blood chemistry: Secondary | ICD-10-CM | POA: Diagnosis not present

## 2021-04-02 DIAGNOSIS — E789 Disorder of lipoprotein metabolism, unspecified: Secondary | ICD-10-CM | POA: Diagnosis not present

## 2021-04-02 DIAGNOSIS — L03115 Cellulitis of right lower limb: Secondary | ICD-10-CM | POA: Diagnosis not present

## 2021-04-19 ENCOUNTER — Encounter: Payer: Self-pay | Admitting: Endocrinology

## 2023-03-06 ENCOUNTER — Ambulatory Visit: Payer: Medicare Other | Admitting: Family Medicine

## 2024-02-25 ENCOUNTER — Emergency Department (HOSPITAL_BASED_OUTPATIENT_CLINIC_OR_DEPARTMENT_OTHER)

## 2024-02-25 ENCOUNTER — Encounter (HOSPITAL_BASED_OUTPATIENT_CLINIC_OR_DEPARTMENT_OTHER): Payer: Self-pay | Admitting: Emergency Medicine

## 2024-02-25 ENCOUNTER — Emergency Department (HOSPITAL_BASED_OUTPATIENT_CLINIC_OR_DEPARTMENT_OTHER)
Admission: EM | Admit: 2024-02-25 | Discharge: 2024-02-25 | Disposition: A | Discharge status: Home / Self Care | Attending: Emergency Medicine | Admitting: Emergency Medicine

## 2024-02-25 ENCOUNTER — Other Ambulatory Visit: Payer: Self-pay

## 2024-02-25 DIAGNOSIS — I10 Essential (primary) hypertension: Secondary | ICD-10-CM | POA: Diagnosis not present

## 2024-02-25 DIAGNOSIS — Z79899 Other long term (current) drug therapy: Secondary | ICD-10-CM | POA: Diagnosis not present

## 2024-02-25 DIAGNOSIS — M25561 Pain in right knee: Secondary | ICD-10-CM | POA: Insufficient documentation

## 2024-02-25 DIAGNOSIS — W19XXXA Unspecified fall, initial encounter: Secondary | ICD-10-CM

## 2024-02-25 LAB — CBC
HCT: 40.7 % (ref 36.0–46.0)
Hemoglobin: 13.1 g/dL (ref 12.0–15.0)
MCH: 29.2 pg (ref 26.0–34.0)
MCHC: 32.2 g/dL (ref 30.0–36.0)
MCV: 90.6 fL (ref 80.0–100.0)
Platelets: 181 10*3/uL (ref 150–400)
RBC: 4.49 MIL/uL (ref 3.87–5.11)
RDW: 14.1 % (ref 11.5–15.5)
WBC: 9.4 10*3/uL (ref 4.0–10.5)
nRBC: 0 % (ref 0.0–0.2)

## 2024-02-25 LAB — COMPREHENSIVE METABOLIC PANEL WITH GFR
ALT: 9 U/L (ref 0–44)
AST: 23 U/L (ref 15–41)
Albumin: 4.2 g/dL (ref 3.5–5.0)
Alkaline Phosphatase: 83 U/L (ref 38–126)
Anion gap: 14 (ref 5–15)
BUN: 15 mg/dL (ref 8–23)
CO2: 23 mmol/L (ref 22–32)
Calcium: 10.3 mg/dL (ref 8.9–10.3)
Chloride: 103 mmol/L (ref 98–111)
Creatinine, Ser: 1.2 mg/dL — ABNORMAL HIGH (ref 0.44–1.00)
GFR, Estimated: 43 mL/min — ABNORMAL LOW (ref >60)
Glucose, Bld: 111 mg/dL — ABNORMAL HIGH (ref 70–99)
Potassium: 3.9 mmol/L (ref 3.5–5.1)
Sodium: 140 mmol/L (ref 135–145)
Total Bilirubin: 0.4 mg/dL (ref 0.0–1.2)
Total Protein: 7.2 g/dL (ref 6.5–8.1)

## 2024-02-25 MED ORDER — ACETAMINOPHEN 500 MG PO TABS
1000.0000 mg | ORAL_TABLET | Freq: Once | ORAL | Status: AC
  Administered 2024-02-25: 1000 mg via ORAL
  Filled 2024-02-25: qty 2

## 2024-02-25 MED ORDER — AMLODIPINE BESYLATE 2.5 MG PO TABS: 2.5000 mg | ORAL_TABLET | Freq: Every day | ORAL | 2 refills | Status: AC

## 2024-02-25 NOTE — Discharge Instructions (Signed)
 You were seen for your knee pain and elevated blood pressure in the emergency department.  X-rays did not show any broken bones.  At home, please ice your knee and elevate it to limit the swelling.  Take Tylenol  for your pain.  You may also use over-the-counter lidocaine patches or topical medications.  Take the amlodipine for your blood pressure.  Check your MyChart online for the results of any tests that had not resulted by the time you left the emergency department.   Follow-up with your primary doctor in 2-3 days regarding your visit.    Return immediately to the emergency department if you experience any of the following: Chest pain, shortness of breath, severe headache, worsening knee pain, or any other concerning symptoms.    Thank you for visiting our Emergency Department. It was a pleasure taking care of you today.

## 2024-02-25 NOTE — ED Triage Notes (Addendum)
 Fell walking from car- fell on both knees. Some intermittent pain in both knees. Denies striking head. Has been ambulatory since. No thinners. Hypertensive in triage- denies CP, SOB, headache, vision changes.

## 2024-02-25 NOTE — ED Provider Notes (Signed)
 [place] EMERGENCY DEPARTMENT AT [facility] Provider Note   CSN: [id number] Arrival date & time: 02/25/24  1953     History  Chief Complaint  Patient presents with   Latasha Maxwell is a 89 y.o. female.  89 year old female with history of hypertension who presents emergency department after a fall.  Patient was getting out of a car when she stumbled and fell onto her side.  Has been having right knee pain since then.  Friend was present and able to get her off the ground quickly.  No head strike or LOC.  Denies pain elsewhere.  Not on blood thinners.  Says her blood pressure has been elevated today and was 170 systolic at her primary care doctor's office.  Takes metoprolol twice daily at 100 mg and did have her morning dose.       Home Medications Prior to Admission medications   Medication Sig Start Date End Date Taking? Authorizing Provider  amLODipine (NORVASC) 2.5 MG tablet Take 1 tablet (2.5 mg total) by mouth daily. 02/25/24 03/26/24 Yes Ninetta Basket, MD  metoprolol tartrate (LOPRESSOR) 100 MG tablet Take 50-100 mg by mouth See admin instructions. 50 MG in the AM and 100 MG in the evening    [provider]      Allergies    Patient has no known allergies.    Review of Systems   Review of Systems  Physical Exam Updated Vital Signs BP (!) 192/77   Pulse 85   Temp 98 F (36.7 C)   Resp 18   SpO2 96%  Physical Exam Constitutional:      General: She is not in acute distress.    Appearance: Normal appearance. She is not ill-appearing.  HENT:     Head: Normocephalic and atraumatic.     Right Ear: External ear normal.     Left Ear: External ear normal.     Mouth/Throat:     Mouth: Mucous membranes are moist.     Pharynx: Oropharynx is clear.  Eyes:     Extraocular Movements: Extraocular movements intact.     Conjunctiva/sclera: Conjunctivae normal.     Pupils: Pupils are equal, round, and reactive to light.  Neck:     Comments:  No C-spine midline tenderness to palpation Pulmonary:     Effort: Pulmonary effort is normal. No respiratory distress.     Breath sounds: Normal breath sounds.  Musculoskeletal:        General: No deformity. Normal range of motion.     Cervical back: No rigidity or tenderness.     Comments: No tenderness to palpation of midline thoracic or lumbar spine.  No step-offs palpated.  No tenderness to palpation of chest wall.  No bruising noted.  No tenderness to palpation of bilateral clavicles.  No tenderness to palpation, bruising, or deformities noted of bilateral shoulders, elbows, wrists, hips, knees, or ankles.  Neurological:     General: No focal deficit present.     Mental Status: She is alert and oriented to person, place, and time. Mental status is at baseline.     Cranial Nerves: No cranial nerve deficit.     Motor: No weakness.     ED Results / Procedures / Treatments   Labs (all labs ordered are listed, but only abnormal results are displayed) Labs Reviewed  COMPREHENSIVE METABOLIC PANEL WITH GFR - Abnormal; Notable for the following components:      Result Value   Glucose,  Bld 111 (*)    Creatinine, Ser 1.20 (*)    GFR, Estimated 43 (*)    All other components within normal limits  CBC    EKG None  Radiology DG Knee Complete 4 Views Left Result Date: 02/25/2024 CLINICAL DATA:  Fall knee pain EXAM: RIGHT KNEE - COMPLETE 4 VIEW; LEFT KNEE - COMPLETE 4 VIEW COMPARISON:  None Available. FINDINGS: Osseous structures are osteopenic. No acute fracture, dislocation or subluxation. No osteolytic or osteoblastic changes. Joint spaces are maintained. No knee joint effusion. IMPRESSION: No acute osseous abnormalities. Electronically Signed   By: Sydell Eva M.D.   On: 02/25/2024 20:36   DG Knee Complete 4 Views Right Result Date: 02/25/2024 CLINICAL DATA:  Fall knee pain EXAM: RIGHT KNEE - COMPLETE 4 VIEW; LEFT KNEE - COMPLETE 4 VIEW COMPARISON:  None Available. FINDINGS:  Osseous structures are osteopenic. No acute fracture, dislocation or subluxation. No osteolytic or osteoblastic changes. Joint spaces are maintained. No knee joint effusion. IMPRESSION: No acute osseous abnormalities. Electronically Signed   By: Sydell Eva M.D.   On: 02/25/2024 20:36    Procedures Procedures    Medications Ordered in ED Medications  acetaminophen  (TYLENOL ) tablet 1,000 mg (1,000 mg Oral Given 02/25/24 2051)    ED Course/ Medical Decision Making/ A&P                                 Medical Decision Making Amount and/or Complexity of Data Reviewed Labs: ordered. Radiology: ordered.  Risk OTC drugs. Prescription drug management.   89 year old female with history of hypertension who presents emergency department with knee pain after a fall  Initial Ddx:  Knee pain, fracture, hypertension, hypertensive emergency  MDM/Course:  Patient presents to the emergency department with knee pain after falling.  Initially was telling triage that she was having bilateral knee pain but tells me mostly right sided knee pain.  I do not appreciate any obvious deformities.  Has no point tenderness to palpation.  She had x-rays that did not show acute findings.  Also was noted to be markedly hypertensive.  I suspect some of this due to pain but she does report that her blood pressure was 170 systolic at her primary care doctor's office today prior to this incident so suspect that some of this is poorly controlled primary hypertension.  Will go ahead and start her on low-dose amlodipine given her age and have her follow-up with her primary doctor in several days for repeat blood work.    This patient presents to the ED for concern of complaints listed in HPI, this involves an extensive number of treatment options, and is a complaint that carries with it a high risk of complications and morbidity. Disposition including potential need for admission considered.   Dispo: DC Home. Return  precautions discussed including, but not limited to, those listed in the AVS. Allowed pt time to ask questions which were answered fully prior to dc.  Additional history obtained from friend Records reviewed Outpatient Clinic Notes I independently reviewed the following imaging with scope of interpretation limited to determining acute life threatening conditions related to emergency care: Extremity x-ray(s) and agree with the radiologist interpretation with the following exceptions: none I personally reviewed and interpreted cardiac monitoring: normal sinus rhythm  I personally reviewed and interpreted the pt's EKG: see above for interpretation  I have reviewed the patients home medications and made adjustments as needed Social Determinants  of health:  Geriatric  Portions of this note were generated with Scientist, clinical (histocompatibility and immunogenetics). Dictation errors may occur despite best attempts at proofreading.     Final Clinical Impression(s) / ED Diagnoses Final diagnoses:  Fall, initial encounter  Acute pain of right knee  Uncontrolled hypertension    Rx / DC Orders ED Discharge Orders          Ordered    amLODipine (NORVASC) 2.5 MG tablet  Daily        02/25/24 2107              Ninetta Basket, MD 02/25/24 2227
